# Patient Record
Sex: Female | Born: 1935 | Race: White | Hispanic: No | Marital: Married | Smoking: Never smoker
Health system: Southern US, Community
[De-identification: ages and names within clinical notes are randomized; demographics above are authoritative.]

## PROBLEM LIST (undated history)

## (undated) DIAGNOSIS — E079 Disorder of thyroid, unspecified: Secondary | ICD-10-CM

## (undated) DIAGNOSIS — I639 Cerebral infarction, unspecified: Secondary | ICD-10-CM

## (undated) DIAGNOSIS — I10 Essential (primary) hypertension: Secondary | ICD-10-CM

## (undated) DIAGNOSIS — F329 Major depressive disorder, single episode, unspecified: Secondary | ICD-10-CM

## (undated) DIAGNOSIS — F32A Depression, unspecified: Secondary | ICD-10-CM

## (undated) DIAGNOSIS — K5790 Diverticulosis of intestine, part unspecified, without perforation or abscess without bleeding: Secondary | ICD-10-CM

## (undated) DIAGNOSIS — E785 Hyperlipidemia, unspecified: Secondary | ICD-10-CM

## (undated) DIAGNOSIS — E876 Hypokalemia: Secondary | ICD-10-CM

## (undated) DIAGNOSIS — F419 Anxiety disorder, unspecified: Secondary | ICD-10-CM

## (undated) DIAGNOSIS — M1990 Unspecified osteoarthritis, unspecified site: Secondary | ICD-10-CM

## (undated) DIAGNOSIS — M810 Age-related osteoporosis without current pathological fracture: Secondary | ICD-10-CM

## (undated) DIAGNOSIS — K219 Gastro-esophageal reflux disease without esophagitis: Secondary | ICD-10-CM

## (undated) HISTORY — PX: ABDOMINAL HYSTERECTOMY: SHX81

## (undated) HISTORY — PX: TONSILLECTOMY: SUR1361

---

## 1998-06-16 ENCOUNTER — Ambulatory Visit (HOSPITAL_COMMUNITY): Admission: RE | Admit: 1998-06-16 | Discharge: 1998-06-16 | Payer: Self-pay | Admitting: *Deleted

## 1998-06-16 ENCOUNTER — Encounter: Payer: Self-pay | Admitting: *Deleted

## 2002-02-13 ENCOUNTER — Ambulatory Visit (HOSPITAL_COMMUNITY): Admission: RE | Admit: 2002-02-13 | Discharge: 2002-02-13 | Payer: Self-pay | Admitting: Gastroenterology

## 2002-02-13 ENCOUNTER — Encounter (INDEPENDENT_AMBULATORY_CARE_PROVIDER_SITE_OTHER): Payer: Self-pay | Admitting: Specialist

## 2007-06-05 ENCOUNTER — Ambulatory Visit: Payer: Self-pay | Admitting: Gastroenterology

## 2007-07-11 ENCOUNTER — Ambulatory Visit: Payer: Self-pay | Admitting: Gastroenterology

## 2007-07-11 ENCOUNTER — Encounter: Payer: Self-pay | Admitting: Gastroenterology

## 2007-08-18 ENCOUNTER — Emergency Department (HOSPITAL_COMMUNITY): Admission: EM | Admit: 2007-08-18 | Discharge: 2007-08-18 | Payer: Self-pay | Admitting: Emergency Medicine

## 2007-09-05 ENCOUNTER — Ambulatory Visit: Payer: Self-pay | Admitting: Gastroenterology

## 2007-09-18 ENCOUNTER — Emergency Department (HOSPITAL_COMMUNITY): Admission: EM | Admit: 2007-09-18 | Discharge: 2007-09-19 | Payer: Self-pay | Admitting: Emergency Medicine

## 2007-10-02 ENCOUNTER — Inpatient Hospital Stay (HOSPITAL_COMMUNITY): Admission: EM | Admit: 2007-10-02 | Discharge: 2007-10-06 | Payer: Self-pay | Admitting: Emergency Medicine

## 2007-10-09 ENCOUNTER — Ambulatory Visit: Payer: Self-pay | Admitting: Internal Medicine

## 2007-10-13 DIAGNOSIS — E079 Disorder of thyroid, unspecified: Secondary | ICD-10-CM | POA: Insufficient documentation

## 2007-10-13 DIAGNOSIS — D126 Benign neoplasm of colon, unspecified: Secondary | ICD-10-CM

## 2007-10-13 DIAGNOSIS — K5730 Diverticulosis of large intestine without perforation or abscess without bleeding: Secondary | ICD-10-CM | POA: Insufficient documentation

## 2007-10-13 DIAGNOSIS — R630 Anorexia: Secondary | ICD-10-CM

## 2007-10-13 DIAGNOSIS — M129 Arthropathy, unspecified: Secondary | ICD-10-CM | POA: Insufficient documentation

## 2007-10-13 DIAGNOSIS — R634 Abnormal weight loss: Secondary | ICD-10-CM

## 2008-02-28 ENCOUNTER — Inpatient Hospital Stay (HOSPITAL_COMMUNITY): Admission: EM | Admit: 2008-02-28 | Discharge: 2008-03-01 | Payer: Self-pay | Admitting: Emergency Medicine

## 2008-08-12 ENCOUNTER — Ambulatory Visit: Payer: Self-pay | Admitting: Diagnostic Radiology

## 2008-08-12 ENCOUNTER — Emergency Department (HOSPITAL_BASED_OUTPATIENT_CLINIC_OR_DEPARTMENT_OTHER): Admission: EM | Admit: 2008-08-12 | Discharge: 2008-08-12 | Payer: Self-pay | Admitting: Emergency Medicine

## 2010-11-13 ENCOUNTER — Inpatient Hospital Stay (HOSPITAL_COMMUNITY)
Admission: EM | Admit: 2010-11-13 | Discharge: 2010-11-18 | DRG: 690 | Disposition: A | Discharge status: Nursing Home (Includes ICF) | Payer: Medicare Other | Attending: Internal Medicine | Admitting: Internal Medicine

## 2010-11-13 DIAGNOSIS — R0902 Hypoxemia: Secondary | ICD-10-CM | POA: Diagnosis not present

## 2010-11-13 DIAGNOSIS — E039 Hypothyroidism, unspecified: Secondary | ICD-10-CM | POA: Diagnosis present

## 2010-11-13 DIAGNOSIS — N289 Disorder of kidney and ureter, unspecified: Secondary | ICD-10-CM | POA: Diagnosis present

## 2010-11-13 DIAGNOSIS — F0280 Dementia in other diseases classified elsewhere without behavioral disturbance: Secondary | ICD-10-CM | POA: Diagnosis present

## 2010-11-13 DIAGNOSIS — K219 Gastro-esophageal reflux disease without esophagitis: Secondary | ICD-10-CM | POA: Diagnosis present

## 2010-11-13 DIAGNOSIS — J9819 Other pulmonary collapse: Secondary | ICD-10-CM | POA: Diagnosis not present

## 2010-11-13 DIAGNOSIS — M810 Age-related osteoporosis without current pathological fracture: Secondary | ICD-10-CM | POA: Diagnosis present

## 2010-11-13 DIAGNOSIS — R5381 Other malaise: Secondary | ICD-10-CM | POA: Diagnosis present

## 2010-11-13 DIAGNOSIS — N10 Acute tubulo-interstitial nephritis: Principal | ICD-10-CM | POA: Diagnosis present

## 2010-11-13 DIAGNOSIS — E876 Hypokalemia: Secondary | ICD-10-CM | POA: Diagnosis present

## 2010-11-13 DIAGNOSIS — Z87440 Personal history of urinary (tract) infections: Secondary | ICD-10-CM

## 2010-11-13 DIAGNOSIS — B964 Proteus (mirabilis) (morganii) as the cause of diseases classified elsewhere: Secondary | ICD-10-CM | POA: Diagnosis present

## 2010-11-13 DIAGNOSIS — G309 Alzheimer's disease, unspecified: Secondary | ICD-10-CM | POA: Diagnosis present

## 2010-11-13 LAB — URINALYSIS, ROUTINE W REFLEX MICROSCOPIC
Nitrite: NEGATIVE
Protein, ur: 30 mg/dL — AB
Specific Gravity, Urine: 1.018 (ref 1.005–1.030)
Urobilinogen, UA: 0.2 mg/dL (ref 0.0–1.0)

## 2010-11-13 LAB — DIFFERENTIAL
Eosinophils Absolute: 0 10*3/uL (ref 0.0–0.7)
Eosinophils Relative: 0 % (ref 0–5)
Lymphocytes Relative: 2 % — ABNORMAL LOW (ref 12–46)
Lymphs Abs: 0.3 10*3/uL — ABNORMAL LOW (ref 0.7–4.0)
Monocytes Relative: 6 % (ref 3–12)

## 2010-11-13 LAB — COMPREHENSIVE METABOLIC PANEL
Albumin: 3.6 g/dL (ref 3.5–5.2)
Alkaline Phosphatase: 39 U/L (ref 39–117)
BUN: 25 mg/dL — ABNORMAL HIGH (ref 6–23)
Chloride: 104 mEq/L (ref 96–112)
Creatinine, Ser: 1.27 mg/dL — ABNORMAL HIGH (ref 0.4–1.2)
GFR calc non Af Amer: 40 mL/min — ABNORMAL LOW (ref >60)
Glucose, Bld: 116 mg/dL — ABNORMAL HIGH (ref 70–99)
Potassium: 4.7 mEq/L (ref 3.5–5.1)
Total Bilirubin: 1.3 mg/dL — ABNORMAL HIGH (ref 0.3–1.2)

## 2010-11-13 LAB — URINE MICROSCOPIC-ADD ON

## 2010-11-13 LAB — CBC
HCT: 39.9 % (ref 36.0–46.0)
MCH: 29.5 pg (ref 26.0–34.0)
MCV: 90.5 fL (ref 78.0–100.0)
Platelets: 170 10*3/uL (ref 150–400)
RBC: 4.41 MIL/uL (ref 3.87–5.11)
WBC: 16 10*3/uL — ABNORMAL HIGH (ref 4.0–10.5)

## 2010-11-13 NOTE — H&P (Signed)
[name], Robbins NO.:  [id number]  MEDICAL RECORD NO.:  [id number]           PATIENT TYPE:  E  LOCATION:  WLED                         FACILITY:  [facility]  PHYSICIAN:  Andreas Blower, MD       DATE OF BIRTH:  February 18, 1923  DATE OF ADMISSION:  11/13/2010 DATE OF DISCHARGE:                             HISTORY & PHYSICAL   PRIMARY CARE PHYSICIAN:  Gloriajean Dell. Andrey Campanile, MD, the patient is also seen by Dr. Dara Hoyer.  CHIEF COMPLAINT:  Fever, chills, and bilateral flank pain.  HISTORY OF PRESENT ILLNESS:  Brianna Robbins is an 77 year old Caucasian female with history of dementia, hypertension, GERD, hypercholesterolemia, hypothyroidism, who presented with fever, chills, and bilaterally flank pain. The patient has a history of frequent urinary tract infections which is managed by Dr. Aldean Ast and is on ciprofloxacin 250 mg p.o. daily chronically.  The patient also has a history of chronic incontinence.  Today the patient woke up this morning at 4 o'clock with back hurting, the patient feeling nauseated, and having some chills.  She was given some Tylenol and meclizine and was transferred to [facility] ED for further evaluation. Currently, the patient reports that her back pain has improved.  While she was in the ER, she was found to be mildly hypotensive with a blood pressure of 95/41, as a result the hospitalist service was asked to admit the patient.  Other than her symptoms today, her family member who is providing most of the history given the patient's dementia reports that she has not complained of any chest pain, shortness of breath, has not complained of any abdominal pain, has had good bowel movements today, has not had any headaches or vision changes.  REVIEW OF SYSTEMS:  All systems were reviewed with the patient was positive as per HPI, otherwise all other systems are negative.  PAST MEDICAL HISTORY: 1. Hypertension. 2. GERD. 3. Dementia. 4.  Hypercholesterolemia. 5. Hypothyroidism. 6. History of shingles. 7. History of falls. 8. History of osteoarthritis. 9. History of osteoporosis, on chronic bisphosphonates. 10.Status post hysterectomy and bilateral tubal ligation. 11.History of normocytic anemia. 12.History of lower GI bleed, thought to be diverticular in nature.  SOCIAL HISTORY:  The patient does not smoke, does not drink any alcohol, currently lives at [facility].  FAMILY HISTORY:  Significant for father having diabetes and heart problems, mother having stroke and heart attack.  HOME MEDICATIONS: 1. Omeprazole 20 mg p.o. daily. 2. Hydrochlorothiazide 12.5 mg p.o. every other day. 3. Alendronate 70 mg p.o. weekly on Tuesdays. 4. Fish oil 3000 mg p.o. daily. 5. Vitamin D3 1000 IU p.o. daily. 6. Multivitamin 1 tablet daily. 7. Synthroid 88 mcg p.o. daily. 8. Nadolol 40 mg p.o. daily. 9. Vytorin 10/40 p.o. daily. 10.Potassium chloride 20 mEq p.o. daily. 11.Aspirin 81 mg p.o. daily. 12.Cranberry powder 850 mg capsule p.o. daily. 13.Tylenol 650 mg p.o. q.6 h. p.r.n. 14.Ciprofloxacin 250 mg p.o. nightly. 15.Ambien 10 mg p.o. nightly p.r.n. 16.Prozac 30 mg p.o. daily. 17.Lorazepam 0.5 mg every 8 hours as needed for anxiety. 18.Meclizine 12.5 mg p.o. q.4 h. p.r.n.  x3 doses.  PHYSICAL EXAMINATION:  VITAL SIGNS:  Temperature is 98.9, blood pressure is 95/41, heart rate 74, respirations 16, saturating at 94% on room air. During examination the patient's blood pressure was improved at 105/50. GENERAL:  The patient is awake, oriented to self and location.  She is not oriented to time, is lying in bed comfortably. HEENT:  Extraocular motions are intact.  Pupils are equal, round.  Has dry mucous membranes. NECK:  Supple. HEART:  Regular with S1 and S2. LUNGS:  Clear to auscultation bilaterally. ABDOMEN:  Soft, nontender, nondistended.  Positive bowel sounds.  Has some tenderness in the  left mid quadrant. EXTREMITIES:  The patient good peripheral pulses with trace edema. NEUROLOGIC:  Cranial nerves II through XII grossly intact.  Has 5/5 motor strength in upper as well as lower extremities.  LABORATORY DATA:  CBC shows a white count of 16.0, hemoglobin 13.0, hematocrit 39.3, platelet count 170.  Electrolytes, normal except BUN is 25, creatinine is 1.25.  Total bilirubin is 1.3, alkaline phosphatase is 39, AST is 58, ALT is 26, total protein is 6.2, albumin is 3.6.  UA was negative for nitrites and large leukocytes, had many bacteria.  Urine culture is pending.  RADIOLOGY/IMAGING:  No imaging obtained.  ASSESSMENT AND PLAN: 1. Recurrent urinary tract infections/pyelonephritis.  The patient     complaining about fever, chills, bilateral flank pain today, there     is concern for pyelonephritis.  Urine culture has been sent.  We     will send for blood cultures x2.  Given the patient has been on     chronic ciprofloxacin, will start her on ceftazidime after     discussion with pharmacy. 2. Hypotension/early sepsis. I suspect that the patient is     dehydrated.  Her blood pressure is improving with fluids, as a     result we will aggressively hydrate her.  We will aggressively     hydrate her and we will also hold some of her antihypertensive     medications such as hydrochlorothiazide and we will continue     nadolol with hold parameters. 3. Leukocytosis. Secondary to UTI. 4. Mild acute renal insuffiency. Likely secondary to dehydration.     Monitor renal function for now, and reassess after hydration. 5. Dehydration.  As mentioned above, we will aggressively hydrate her     with IV fluids. 6. Hypothyroidism.  Continue Synthroid. 7. Gastroesophageal reflux disease.  Continue the patient on PPI. 8. Hyperlipidemia.  Continue fish oil and Vytorin. 9. Depression.  We will continue Prozac. 10.Hypertension.  As discussed above, we will continue nadolol with     hold  parameters, currently holding hydrochlorothiazide. 11.Osteoporosis and osteoarthritis. We will hold bisphosphonates while     the patient is admitted and continue vitamin D. 12.Anxiety.  Continue p.r.n. Ativan. 13.Prophylaxis, Lovenox. 14.Code status.  The patient is DNR/DNI.  Has a "GOLD DNR form" at     signed by her primary care physician. Her code status was also     confirmed with the patient's sister at the time of admission.  Time spent on admission talking to the patient, patient's family, and coordinating care was 50 minutes.   Andreas Blower, MD   SR/MEDQ  D:  11/13/2010  T:  11/13/2010  Job:  [id number]  Electronically Signed by Wardell Heath Sarrah Fiorenza  on 11/13/2010 09:08:40 PM

## 2010-11-14 LAB — BASIC METABOLIC PANEL
CO2: 24 mEq/L (ref 19–32)
Chloride: 108 mEq/L (ref 96–112)
Creatinine, Ser: 1.04 mg/dL (ref 0.4–1.2)
GFR calc Af Amer: >60 mL/min (ref >60)
Glucose, Bld: 101 mg/dL — ABNORMAL HIGH (ref 70–99)

## 2010-11-14 LAB — CBC
HCT: 33.2 % — ABNORMAL LOW (ref 36.0–46.0)
Hemoglobin: 10.6 g/dL — ABNORMAL LOW (ref 12.0–15.0)
MCH: 29 pg (ref 26.0–34.0)
MCHC: 31.9 g/dL (ref 30.0–36.0)
MCV: 91 fL (ref 78.0–100.0)
RBC: 3.65 MIL/uL — ABNORMAL LOW (ref 3.87–5.11)

## 2010-11-15 ENCOUNTER — Inpatient Hospital Stay (HOSPITAL_COMMUNITY): Payer: Medicare Other

## 2010-11-15 LAB — COMPREHENSIVE METABOLIC PANEL
Albumin: 2.7 g/dL — ABNORMAL LOW (ref 3.5–5.2)
BUN: 17 mg/dL (ref 6–23)
CO2: 27 mEq/L (ref 19–32)
Calcium: 8 mg/dL — ABNORMAL LOW (ref 8.4–10.5)
Chloride: 110 mEq/L (ref 96–112)
Creatinine, Ser: 0.78 mg/dL (ref 0.4–1.2)
GFR calc non Af Amer: >60 mL/min (ref >60)
Total Bilirubin: 0.5 mg/dL (ref 0.3–1.2)

## 2010-11-15 LAB — CBC
MCH: 29.3 pg (ref 26.0–34.0)
MCHC: 31.9 g/dL (ref 30.0–36.0)
MCV: 91.8 fL (ref 78.0–100.0)
Platelets: 135 10*3/uL — ABNORMAL LOW (ref 150–400)
RBC: 3.65 MIL/uL — ABNORMAL LOW (ref 3.87–5.11)

## 2010-11-16 LAB — CBC
HCT: 34 % — ABNORMAL LOW (ref 36.0–46.0)
MCH: 28.6 pg (ref 26.0–34.0)
MCHC: 31.8 g/dL (ref 30.0–36.0)
MCV: 89.9 fL (ref 78.0–100.0)
RDW: 13.9 % (ref 11.5–15.5)

## 2010-11-17 ENCOUNTER — Inpatient Hospital Stay (HOSPITAL_COMMUNITY): Payer: Medicare Other

## 2010-11-17 LAB — BASIC METABOLIC PANEL
CO2: 29 mEq/L (ref 19–32)
Calcium: 8.4 mg/dL (ref 8.4–10.5)
Creatinine, Ser: 0.62 mg/dL (ref 0.4–1.2)
GFR calc Af Amer: >60 mL/min (ref >60)

## 2010-11-17 LAB — URINE CULTURE
Colony Count: >=100000
Culture  Setup Time: 201203310131

## 2010-11-17 LAB — CBC
Hemoglobin: 11.2 g/dL — ABNORMAL LOW (ref 12.0–15.0)
MCH: 28.6 pg (ref 26.0–34.0)
MCHC: 31.9 g/dL (ref 30.0–36.0)
Platelets: 157 10*3/uL (ref 150–400)

## 2010-11-20 LAB — CULTURE, BLOOD (ROUTINE X 2): Culture  Setup Time: 201203310131

## 2010-11-23 NOTE — Discharge Summary (Signed)
Brianna Robbins, Brianna Robbins                 ACCOUNT NO.:  [id number]  MEDICAL RECORD NO.:  [id number]           PATIENT TYPE:  I  LOCATION:  1319                         FACILITY:  [facility]  PHYSICIAN:  Hollice Espy, M.D.DATE OF BIRTH:  Oct 18, 1922  DATE OF ADMISSION:  11/13/2010 DATE OF DISCHARGE:                        DISCHARGE SUMMARY - REFERRING   PRIMARY CARE PHYSICIAN:  Gloriajean Dell. Andrey Campanile, M.D.  DISCHARGE DIAGNOSES: 1. Proteus urinary tract infection/pyelonephritis. 2. Mild bronchitis. 3. Hypoxia, felt to be secondary to atelectasis versus mild     bronchitis. 4. Alzheimer's dementia. 5. Deconditioning. 6. Acute renal insufficiency, now resolved. 7. Dehydration secondary to urinary tract infection, now resolved. 8. History depression. 9. History of hypothyroidism. 10.History of gastroesophageal reflux disease. 11.History of osteoarthritis.  All discharge diagnoses were present on admission.  DISPOSITION:  Disposition for this patient is improved.  ACTIVITY:  Activity is as per assisted living, PT/OT.  DISCHARGE DIET:  Low-sodium diet.  HOSPITAL COURSE:  The patient is an 77 year old white female with past medical history of Alzheimer's dementia, hypertension and depression, who presented with weakness.  She has a history of recurrent urinary tract infection and was found to have large urinary tract infection with acute pyelonephritis.  She was started on IV Rocephin.  Over the next several days, she improved.  She was started on IV fluids and she responded well.  Initially, the plan was to send her home on November 17, 2010 but it was noted that she was quite winded.  Physical therapy checked the patient and she is found to have some decreased O2 saturations.  With ambulation, her saturations  were up to about anywhere from 86% to 89%.  The patient was put on 2 liters of oxygen.  A chest x-ray was ordered.  She had a questionable small area of infiltrate at the left base,  although it was twisted and it was very poor film.  The patient had been afebrile regardless.  In addition, BNP was checked and found to be only minimally elevated in the 200s.  Given these findings, it was felt likely hypoxia.  Hypoxia may be from a mild bronchitis versus some atelectasis from being in the chair and bed during her hospitalization.  The patient was started on 2 liters of oxygen.  Encourage using spirometer.  Zithromax was started on the patient by 1 day later.  The patient was breathing comfortably.  She was ambulating well on the walker.  She was not felt to need short-term skilled nursing and could go back to assisted living with home health PT/OT.  In addition, we will go ahead and treat her.  She has had a bronchitis with 5 more days of p.o. Zithromax and her urine culture grew positive for Proteus which was sensitive to cephalosporin, so we will change her Rocephin which she has received 5 days worth for 2 more days for a total of 7 days of therapy.  The patient was kept as a DNR as per her living will during her entire hospitalization.  She is otherwise felt to be medically stable and she will be discharged on November 18, 2010.  She will follow up with her PCP, Dr. Benedetto Goad, in the next 2 to 4 weeks.     Hollice Espy, M.D.     SKK/MEDQ  D:  11/18/2010  T:  11/18/2010  Job:  [id number]  cc:   Gloriajean Dell. Andrey Campanile, M.D. Fax: [phone]  [address]  Electronically Signed by Virginia Rochester M.D. on 11/23/2010 01:58:18 PM

## 2010-12-29 NOTE — Assessment & Plan Note (Signed)
[facility]                         GASTROENTEROLOGY OFFICE NOTE   NAME:Robbins, Brianna Robbins                        MRN:          [id number]  DATE:09/05/2007                            DOB:          10-09-1922    PROBLEM:  Nausea.   [name] has returned for reevaluation since her colonoscopy in early  December.  She is on no new medications.  She has been complaining of  anorexia.  She claims to have lost about 12 pounds.  She is also having  loose stools.  This is not new for her, but it is worse than prior to  her procedure.  She is without abdominal pain.   EXAMINATION:  Pulse 56.  Blood pressure 100/60.  Weight 155.  HEENT: EOMI.  PERRLA.  Sclerae are anicteric.  Conjunctivae are pink.  NECK:  Supple without thyromegaly, adenopathy or carotid bruits.  CHEST:  Clear to auscultation and percussion without adventitious  sounds.  CARDIAC:  Regular rhythm; normal S1 S2.  There are no murmurs, gallops  or rubs.  ABDOMEN:  There is no succussion splash.  Bowel sounds are normoactive.  Abdomen is soft, nontender and nondistended.  There are no abdominal  masses, tenderness, splenic enlargement or hepatomegaly.  EXTREMITIES:  Full range of motion.  No cyanosis, clubbing or edema.  RECTAL:  Deferred.   IMPRESSION:  Anorexia with weight loss.  Symptoms certainly suggest a  medication-effect, though she has had no change in her medications.  Loose bowels are nothing new for her.  She is on several medications  that can cause her symptoms of nausea and diarrhea, including  nitrofurantoin, omeprazole, Paxil.   RECOMMENDATION:  Trial of Levbid 0.375 mg twice a day.  If she is not  improved with this, then I would consider stopping her medication for  possible offending medications, one at a time.     Barbette Hair. Arlyce Dice, MD,FACG  Electronically Signed    RDK/MedQ  DD: 09/05/2007  DT: 09/05/2007  Job #: [id number]   cc:   Caryn Bee L. Little, M.D.

## 2010-12-29 NOTE — Letter (Signed)
June 05, 2007    [name]   RE:  Brianna Robbins, Brianna Robbins  MRN:  [id number]  /  DOB:  1922/12/13   Dear [name]:   It is my pleasure to have treated you recently as a new patient in my  office.  I appreciate your confidence and the opportunity to participate  in your care.   Since I do have a busy inpatient endoscopy schedule and office schedule,  my office hours vary weekly.  I am, however, available for emergency  calls every day through my office.  If I cannot promptly meet an urgent  office appointment, another one of our gastroenterologists will be able  to assist you.   My well-trained staff are prepared to help you at all times.  For  emergencies after office hours, a physician from our gastroenterology  section is always available through my 24-hour answering service.   While you are under my care, I encourage discussion of your questions  and concerns, and I will be happy to return your calls as soon as I am  available.   Once again, I welcome you as a new patient and I look forward to a happy  and healthy relationship.    Sincerely,      Barbette Hair. Arlyce Dice, MD,FACG  Electronically Signed   RDK/MedQ  DD: 06/05/2007  DT: 06/06/2007  Job #: [id number]

## 2010-12-29 NOTE — H&P (Signed)
NAMEYEILY, LINK                 ACCOUNT NO.:  [id number]   MEDICAL RECORD NO.:  [id number]          PATIENT TYPE:  INP   LOCATION:  1534                         FACILITY:  [facility]   PHYSICIAN:  Hedwig Morton. Juanda Chance, MD     DATE OF BIRTH:  1922/08/25   DATE OF ADMISSION:  10/02/2007  DATE OF DISCHARGE:                              HISTORY & PHYSICAL   PROBLEM:  GI bleeding.   HISTORY:  Brianna Robbins is a pleasant, 77 year old, white female recently known  to Dr. Arlyce Dice having undergone colonoscopy for screening on July 11, 2007. She does have a positive family history of colon cancer.  She was  found at that time to have several diminutive colon polyps and diffuse  left-sided diverticulosis. She also has a history of hypothyroidism,  osteoarthritis, mild dementia, is status post hysterectomy and bilateral  tubal ligation.  The patient has been ill over the past month or so  initially with shingles and then a viral illness with nausea, anorexia,  etc. which required an ER visit for hydration.  Apparently she had onset  this morning about 4:00 a.m. with an urge for a bowel movement, got up,  went to the bathroom and had a gush of blood in the commode.  Apparently  she had also worn a sanitary pad to bed because of bladder control  issues and that was soaked with blood as well.  She had not noticed any  melena or hematochezia.  last week.  No complaints of diarrhea.  Denies  any current abdominal pain, did not have any syncope or presyncope, but  does feel a bit lightheaded. She has not had any nausea or vomiting.  Her sister who accompanies her says that she has had a lot of recent  falls due to lightheadedness. She was seen by Dr. Clarene Duke this morning in  his office, noted to have bright red blood on rectal exam and then  referred to GI for admission.  She has not had any further bowel  movements thus far.   CURRENT LABS:  Labs on September 18, 2007 showed a hemoglobin of 12.8,  hematocrit of  37, WBC of 8. Follow up on the 16th shows a hemoglobin of  11.4, hematocrit of 32.6, WBC of 8.5, platelets 299.  The remainder of  labs are pending.   CURRENT MEDICATIONS:  1. HCTZ 25 daily.  2. Vytorin 10/40 daily.  3. Aricept 10 daily.  4. Prozac 20 daily.  5. Baby aspirin 81 mg daily.  6. Nadolol 40 mg daily.  7. Prilosec 20 daily.  8. Fosamax 70 weekly.  9. Levothyroxine 88 mcg daily.   ALLERGIES:  SULFA which causes itching.   FAMILY HISTORY:  Mother with history of colon cancer.   SOCIAL HISTORY:  The patient is married, lives with her husband who has  Alzheimer's. The patient has a sister who has been helping them but does  not live with them. No tobacco and no ETOH.   REVIEW OF SYSTEMS:  GENERAL:  Pertinent for weakness and recent weight  loss.  She says she  feels like she is falling apart.  CARDIOVASCULAR:  Denies any chest pain or anginal symptoms.  PULMONARY:  Negative for  cough, shortness of breath, sputum production.  GENITOURINARY:  Denies  any dysuria, urgency or frequency.  GI: As above.  MUSCULOSKELETAL:  Positive for a recent fall with a bruise on her lower right back.  NEURO:  Unremarkable. All other review of systems negative.   PHYSICAL EXAM:  Well-developed elderly white female in no acute  distress.  She is alert and oriented x3, blood pressure 130/60, pulse in  the 70s, temperature is 97.  HEENT:  Nontraumatic, normocephalic.  EOMI, PERRLA.  Sclerae anicteric.  NECK:  Supple.  There is no JVD.  She does have a fading rash of zoster  over her right shoulder.  LUNGS:  Clear to A and P.  CARDIOVASCULAR:  Regular rate and rhythm with S1 and S2.  There is no  murmur, rub or gallop.  ABDOMEN:  Bowel sounds are active. She is tender in the left mid  quadrant with a smooth fullness in the left mid quadrant which is  tender. This may be the splenic edge which is then enlarged. No  hepatomegaly noted.  RECTAL:  Not repeated. There was some maroon stool noted  per Dr. Clarene Duke  earlier this morning.  NEURO:  The patient is alert and oriented x3.  EXTREMITIES:  No clubbing, cyanosis or edema. She does have a small a  bruise on her right back.   IMPRESSION:  1. An 77 year old white female with acute lower gastrointestinal      bleed, probably diverticular.  Rule out ischemic.  2. Normocytic anemia secondary to above.  3. Left mid quadrant abdominal tenderness and fullness of unclear      etiology, question splenomegaly.  4. Recent anorexia and weight loss of unclear etiology. She has had      recent herpes zoster and this may be secondary.  5. Hypertension.  6. Osteoarthritis.  7. Status post hysterectomy and bilateral tubal ligation.  8. Hypothyroidism.  9. Recent falls.   PLAN:  The patient is admitted to the service of Dr. Lina Sar for  serial H&H transfusions as needed.  She will be kept on a clear liquid  diet, will transfuse to keep her hemoglobin between 9 and 10. Will plan  to hold her blood pressure medicines and aspirin initially and will  check CT scan of the abdomen and pelvis to assess left abdominal  fullness, recent weight loss, anorexia, etc. For details please see the  orders.      Amy Esmond, PA-C      Dora M. Juanda Chance, MD  Electronically Signed    AE/MEDQ  D:  10/02/2007  T:  10/03/2007  Job:  [id number]   cc:   Caryn Bee L. Little, M.D.  Fax: [phone]

## 2010-12-29 NOTE — Letter (Signed)
June 05, 2007    Anna Genre. Little, M.D.  [address]  [address] [place], [place] [address]   RE:  [name], Brianna Robbins  MRN:  [id number]  /  DOB:  10/07/1922   Dear Dr. Clarene Duke:   Upon your kind referral, I had the pleasure of evaluating your patient  and I am pleased to offer my findings.  I saw [name] in the office  today.  Enclosed is a copy of my progress note that details my findings  and recommendations.   Thank you for the opportunity to participate in your patient's care.    Sincerely,      Barbette Hair. Arlyce Dice, MD,FACG  Electronically Signed    RDK/MedQ  DD: 06/05/2007  DT: 06/06/2007  Job #: [id number]

## 2010-12-29 NOTE — H&P (Signed)
Brianna Robbins, Brianna Robbins                 ACCOUNT NO.:  [id number]   MEDICAL RECORD NO.:  [id number]          PATIENT TYPE:  INP   LOCATION:  4732                         FACILITY:  [facility]   PHYSICIAN:  Kela Millin, M.D.DATE OF BIRTH:  05-20-1923   DATE OF ADMISSION:  02/28/2008  DATE OF DISCHARGE:                              HISTORY & PHYSICAL   CHIEF COMPLAINT:  Worsening gait imbalance and speech difficulty.   HISTORY OF PRESENT ILLNESS:  The patient is an 77 year old white female  with past medical history significant for hypertension,  hypercholesterolemia, hypothyroidism, and history of herpes zoster,  recently diagnosed with post herpetic neuralgia and started on  amitriptyline (a total of 100 mg daily) per her dermatologist, dementia,  history of falls who presents with the above complaints.  The history is  obtained from her sister, who is at the bedside, and her primary care  physician.  Her sister states that about 3-4 days ago Brianna Robbins  complained of her tongue feeling sore and she noticed that she was  having some speech difficulty at the time as well - stating that it felt  like her tongue was thick.  When she went by to see her she appeared  to be staggering and having difficulties with her balance.  The symptoms  persisted and when she went back to check on her, on the day prior to  admission, she decided that she would take her to the walk-in clinic.  At the walk-in clinic, she was diagnosed with thrush and started on oral  nystatin.  On the day of presentation she went by again to visit and she  found that her gait/balance was worse - she was stumbling and could  hardly stand up without falling and also what was more confused than  usual, was more somnolent and having difficulty still with her speech.  Her sister states that she also noticed that her medications, which had  been laid out for her the night before for the whole day, had all been  taken already, and  this was still in the morning, so she is unsure  exactly what happened, whether she took them all at once is not clear.  The patient's sister also reports that the only new medication that  patient had been started on shortly before the onset of the symptoms was  the amitriptyline started by her dermatologist for post herpetic  neuralgia.  The patient reports a dry mouth as well.  She denies blurry  vision, focal weakness, dysphagia, nausea, vomiting, fevers, dysuria,  melena, cough, chest pain, diarrhea and no hematochezia.   The patient was seen at the office by Dr. Sigmund Hazel and was noted to  have difficulty articulating words and unable to stand without support  and also difficulty with finger-to-nose and rapidly alternating  movements upon exam and so she was directly admitted for further  evaluation and management.   PAST MEDICAL HISTORY:  1. As stated above.  2. History of GI bleed secondary to diverticular hemorrhage and      resulting anemia.  3. Hiatal  hernia.  4. Osteoarthritis.  5. Recent history of anorexia/weight loss secondary to multifactorial      etiology.   PAST SURGICAL HISTORY:  Status post hysterectomy and tubal ligation.   SOCIAL HISTORY:  She denies tobacco, also denies alcohol.  She is  married and lives with her husband who has Alzheimer's and her sister  helps out but does not live with them.   FAMILY HISTORY:  Her mother had colon cancer.   REVIEW OF SYSTEMS:  As per HPI.   PHYSICAL EXAM:  GENERAL:  The patient is an overweight elderly white  female with some hearing impairment, in no apparent distress.  Alert,  oriented to person.  VITAL SIGNS:  Temperature is 97.2 with a pulse of 57, respiratory rate  is 20, blood pressure 146/79, O2 sat 97% on room air.  HEENT:  PERRL, EOMI, sclerae anicteric, slightly dry mucous membranes,  no oral exudates.  NECK:  Supple, no adenopathy, no thyromegaly, no JVD and no carotid  bruits.  LUNGS:  Clear to  auscultation bilaterally, no crackles or wheezes.  CARDIOVASCULAR:  Regular, normal S1, S2.  ABDOMEN:  Obese, soft, bowel sounds present, nontender, nondistended, no  organomegaly and no masses palpable.  EXTREMITIES:  No cyanosis, no edema.  NEURO:  She follows simple commands, cranial nerves II-XII are grossly  intact, she misses the target on finger-to-nose testing, she does well  with the heel-to-shin testing bilaterally, Romberg's is positive, she is  unable to stand with her feet together.  On her plantar reflexes, she  withdraws both feet.  No facial asymmetry, her tongue is midline.   LABORATORY DATA:  CT scan, CBC, CMET, TSH ordered and pending.   ASSESSMENT AND PLAN:  1. Speech difficulty and gait abnormality - As discussed above,      cerebrovascular accident versus medication/amitriptyline adverse      effects), known side effects include incoordination, ataxia,      confusion and disorientation.  Will obtain a CT scan of the head,      follow and consider MRI to evaluate posterior circulation.  Will      place on aspirin, follow and consider neuro consult pending above      studies.  Will consult physical therapy, occupational therapy as      well and follow.  2. Hypertension - Continue outpatient medications, hold      hydrochlorothiazide for now pending chemistries.  3. Hypothyroidism - Check TSH, continue outpatient medications.  4. History of falls - Physical therapy, occupational therapy consult      as above.  5. History of diverticular hemorrhage - check hemoglobin and      hematocrit, no evidence of gastrointestinal bleeding at this time,      follow.      Kela Millin, M.D.  Electronically Signed     ACV/MEDQ  D:  02/29/2008  T:  02/29/2008  Job:  [id number]   cc:   Caryn Bee L. Little, M.D.  Sigmund Hazel, M.D.

## 2010-12-29 NOTE — Assessment & Plan Note (Signed)
[name]                         GASTROENTEROLOGY OFFICE NOTE   NAME:Brianna Robbins, Robbins                        MRN:          [id number]  DATE:06/05/2007                            DOB:          08-May-1923    REASON FOR CONSULTATION:  Screening colonoscopy.   REASON:  Brianna Robbins is a pleasant 77 year old white female referred  through the courtesy of Dr. Clarene Duke for evaluation.  Family history is  pertinent for her father who had colon cancer at age 26.  Brianna Robbins had  no GI complaints except for her typical 3 bowel movements a day  following each meal.  She underwent colonoscopy with removal of a  diminutive hyperplastic polyp in 2003.   HPI:  Left sided diverticula were also seen.   PAST MEDICAL HISTORY:  Is pertinent for:  1. Arthritis.  2. Thyroid disease.  3. She is status post hysterectomy, tubal ligation.   FAMILY HISTORY:  Pertinent for:  1. Heart disease in both parents.  2. Father with diabetes.   MEDICATIONS:  HCTZ, Vytorin, nadolol, potassium, Baby Aspirin,  levothyroxine, nitrofurantoin, omeprazole, fluoxetine, Aricept,  alendronate, and Fish Oil.   SHE IS ALLERGIC TO SULFA.   She neither smokes nor drinks.  She is married and retired.   REVIEW OF SYSTEMS:  Positive for occasional pyrosis, joint pain,  sleeping problems, back pain, excessive urination, and loss of hearing.   PHYSICAL EXAMINATION:  Pulse 60, blood pressure 136/80, weight 178.  HEENT: EOMI.  PERRLA.  Sclerae are anicteric.  Conjunctivae are pink.  NECK:  Supple without thyromegaly, adenopathy or carotid bruits.  CHEST:  Clear to auscultation and percussion without adventitious  sounds.  CARDIAC:  Regular rhythm; normal S1 S2.  There are no murmurs, gallops  or rubs.  ABDOMEN:  Bowel sounds are normoactive.  Abdomen is soft, nontender and  nondistended.  There are no abdominal masses, tenderness, splenic  enlargement or hepatomegaly.  EXTREMITIES:  Full range of  motion.  No cyanosis, clubbing or edema.  RECTAL:  Deferred.   IMPRESSION:  1. Family history of colorectal carcinoma.  2. Diverticulosis.   RECOMMENDATION:  Colonoscopy.     Barbette Hair. Arlyce Dice, MD,FACG  Electronically Signed    RDK/MedQ  DD: 06/05/2007  DT: 06/06/2007  Job #: [id number]   cc:   Caryn Bee L. Little, M.D.

## 2010-12-29 NOTE — Discharge Summary (Signed)
NAMESALENE, Brianna Robbins                 ACCOUNT NO.:  [id number]   MEDICAL RECORD NO.:  [id number]          PATIENT TYPE:  INP   LOCATION:  1534                         [facility]ACILITY:  [facility]   PHYSICIAN:  Iva Boop, MD,FACGDATE OF BIRTH:  Jul 24, 1923   DATE OF ADMISSION:  10/02/2007  DATE OF DISCHARGE:  10/06/2007                               DISCHARGE SUMMARY   ADMITTING DIAGNOSES:  24. A 77 year old, white female with acute lower gastrointestinal      bleed, probable diverticular, rule out ischemic colitis.  2. Normocytic anemia secondary to above.  3. Left mid quadrant tenderness, questionable etiology.  4. Recent anorexia and weight loss, probably multifactorial with      recent herpes zoster and gastroenteritis.  5. Hypertension.  6. Osteoarthritis.  7. Status post hysterectomy and bilateral tubal ligation.  8. Hypothyroidism.  9. Recent falls.   DISCHARGE DIAGNOSES:  1. Stable status post acute diverticular hemorrhage.  2. Anemia secondary to #1, stable post transfusion.  3. A 5-cm, hiatal hernia, otherwise negative      esophagogastroduodenoscopy.  4. Recent anorexia and weight loss, again suspect multifactorial with      recent viral illness, herpes zoster and component of dementia.  5. Osteoarthritis.  6. Status post hysterectomy and bilateral tubal ligation.  7. Hypothyroidism.  8. Recent falls.   CONSULTATIONS:  None.   PROCEDURES:  1. Upper endoscopy and colonoscopy.  2. CT scan of the abdomen and pelvis.  3. HIDA scan.   HISTORY OF PRESENT ILLNESS:  Brianna Robbins is a pleasant, 77 year old, white  female recently known to Dr. Arlyce Dice, primary patient of Dr. Catha Gosselin  who underwent colonoscopy July 11, 2007, for screening.  She does  have positive family history of colon cancer.  She was found to have  several diminutive colon polyps and diffuse left-sided diverticular  disease.  She has been ill over the past month or so initially with an  apparent viral illness  and then shingles and has had a lot of pain from  that.  She has had anorexia, weight loss and some nausea.   On the morning of admission, she had onset about 4 a.m. with urge for  bowel movement, got up, went to the bathroom and had a gush of bright  red blood in the commode.  She apparently also soaked a sanitary pad  with blood that she wears because of bladder incontinence issues.  She  had not noted any blood in her bowel movements over the past week with  no diarrhea, etc.  She currently denies any abdominal pain.  She had not  had any nausea, vomiting, syncope, presyncope, etc., but does admit to  feeling lightheaded which is apparently a frequent symptom for her.  She  was seen by Dr. Clarene Duke in his office on the morning of admission, noted  to have bright red blood on rectal exam and referred to GI for  admission.  She has not had any further bowel movements thus far and is  admitted with probable diverticular hemorrhage.   LABORATORY DATA AND X-RAY FINDINGS:  On February  16, hemoglobin was  11.4, hematocrit of 32.6.  On February 17, hemoglobin 9.9, hematocrit of  28.3.  On February 18, hemoglobin 12.9 and hematocrit 37.6.  On February  19, hemoglobin 12.1 and hematocrit 34.8.  On February 20, hemoglobin  12.2, hematocrit of 35.1.  Electrolytes on admission within normal  limits with BUN 17, creatinine 0.8.  Liver function studies normal.  Albumin 2.9.  Serum iron 81, TIBC of 284, saturation of 29.  B12 was  171.   CT scan of the abdomen and pelvis done on February 16, with  cholelithiasis degenerative disk disease, sigmoid diverticulosis,  questionable mild wall thickening in the cecum.  HIDA scan done on  February 17, showed a patent cystic duct excluding acute cholecystitis.   HOSPITAL COURSE:  The patient was admitted to the service of Dr. Lina Sar who was covering the hospital.  She was initially placed on IV  fluids, bed rest clear liquids, serial H&H's.  By the  following morning,  she was continuing to have frequent, small bloody stools.  Her  hemoglobin had drifted and she was transfused 2 units of packed RBCs.  HIDA scan was obtained because of recent complaints of nausea, anorexia  and weight loss and finding of cholelithiasis on CT scan.  She continued  to have a somewhat stuttering bleed over the next couple of days and  remained hemodynamically stable.  She was prepped with MiraLax on  February 18, and then underwent colonoscopy with Dr. Leone Payor on February  19, because of persistent slow, stuttering bleed.  She was found to have  diverticulosis on the left side of the colon.  There was no bleeding  seen and no blood in the colon at that time.  She also had upper  endoscopy which showed a 5-cm hiatal hernia, otherwise normal exam.  Her  diet was advanced.  Her hemoglobin remained stable in the 12 range and  plan at this time is for her to be discharged to home today, October 06, 2007, with follow up with Dr. Catha Gosselin in 1-2 weeks.   DIET:  She is a placed on a regular diet to avoid popcorn, corn and  nuts.  Also, hold her aspirin.   DISCHARGE MEDICATIONS:  All medications same as on admission with the  exception of avoiding all aspirin and NSAIDs.   Discussion was had during this hospitalization regarding the patient and  her husband's ability to remain in her home.  Her husband has what  appears to be fairly advanced Alzheimer's and the patient does show some  signs of dementia herself and family is at this time in the process of  trying to get them resituated in an assisted-living facility which seems  most appropriate.      Amy Esterwood, PA-C      Iva Boop, MD,FACG  Electronically Signed    AE/MEDQ  D:  10/06/2007  T:  10/07/2007  Job:  [id number]

## 2011-01-01 NOTE — Procedures (Signed)
[facility]  Patient:    Brianna Robbins, Brianna Robbins Visit Number: [id number] MRN: [id number]          Service Type: END Location: ENDO Attending Physician:  Nelda Marseille Dictated by:   Petra Kuba, M.D. Proc. Date: 02/13/02 Admit Date:  02/13/2002 Discharge Date: 02/13/2002   CC:         Al Decant. Janey Greaser, M.D.   Procedure Report  PROCEDURE:  Colonoscopy with biopsy.  ENDOSCOPIST:  Petra Kuba, M.D.  INDICATIONS:  The patient has a family history of colon cancer and is due for colonic screening as well as some urgency and diarrhea.  INFORMED CONSENT:  Consent was signed after risks, benefits, methods and options were thoroughly discussed in the office.  MEDICATIONS USED:  Demerol 30 mg, Versed 7 mg.  DESCRIPTION OF PROCEDURE:  Rectal inspection was pertinent for external hemorrhoids.  Digital exam was negative.  The pediatric adjustable colonoscope was inserted and fairly easily advanced around the colon to the cecum.  This initially required rolling her on her back with some abdominal pressure and then rolling her on her right side.  On insertion, some left-sided diverticula were seen but no other abnormalities. The cecum and ascending had a fair prep with some stool adherent to the wall of the colon which required lots of washing and suctioning.  The rest of the prep was adequate.  The cecum was identified by the appendiceal orifice and the ileocecal valve.  The scope was slowly withdrawn.  The right side of the colon was normal.  As the scope was withdrawn around the left side, scattered diverticula were seen, but no polypoid lesions, masses or other abnormalities. Once back in the rectum, the scope was retroflexed pertinent for some internal hemorrhoids but also a tiny hyperplastic appearing polyp on retroflexion which was cold biopsied x1.  The scope was straightened and air was suctioned.  The scope was removed.  The patient tolerated  the procedure well.  There was no obvious immediate complication.  ENDOSCOPIC DIAGNOSES: 1.  Internal and external hemorrhoids. 2.  Tiny hyperplastic appearing rectal polyp status post cold biopsy on     retroflexion. 3.  Scattered left-sided diverticula. 4.  Largely within normal limits to the cecum.  PLAN:  Await pathology but consider screening if doing well medically in five years.  Happy to see back p.r.n.  Otherwise return care to Dr. Janey Greaser for the customary health care maintenance to include yearly rectals and guaiacs with consideration of trying an antispasmodic possibly either Carafate or Questran if symptoms continue.  We will continue work-up with an upper GI and small bowel follow through.  Will be happy to see back as above if needed.  May also check a gallbladder ultrasound although it does not sound like her symptoms are coming from that. Dictated by:   Petra Kuba, M.D. Attending Physician:  Nelda Marseille DD:  02/13/02 TD:  02/15/02 Job: [id number]

## 2011-05-07 LAB — CBC
HCT: 37
HCT: 28.8 — ABNORMAL LOW
HCT: 32.6 — ABNORMAL LOW
Hemoglobin: 11.4 — ABNORMAL LOW
Hemoglobin: 12
MCHC: 34.5
MCHC: 35.2
MCV: 87.6
MCV: 87.7
MCV: 87.9
Platelets: 235
Platelets: 256
RDW: 14.8
RDW: 15
RDW: 16 — ABNORMAL HIGH
WBC: 8

## 2011-05-07 LAB — COMPREHENSIVE METABOLIC PANEL
ALT: 16
Albumin: 3.9
Alkaline Phosphatase: 36 — ABNORMAL LOW
BUN: 10
BUN: 17
BUN: 5 — ABNORMAL LOW
CO2: 28
Calcium: 9.1
Calcium: 8.6
Chloride: 92 — ABNORMAL LOW
Creatinine, Ser: 0.57
Creatinine, Ser: 0.65
Creatinine, Ser: 0.82
GFR calc non Af Amer: >60
Glucose, Bld: 111 — ABNORMAL HIGH
Glucose, Bld: 131 — ABNORMAL HIGH
Potassium: 4.2
Sodium: 139
Total Bilirubin: 1.2
Total Bilirubin: 0.6
Total Protein: 5.2 — ABNORMAL LOW
Total Protein: 6.3

## 2011-05-07 LAB — HEMOGLOBIN AND HEMATOCRIT, BLOOD
HCT: 28.3 — ABNORMAL LOW
HCT: 35.1 — ABNORMAL LOW
HCT: 37.6
Hemoglobin: 12.2
Hemoglobin: 12.4
Hemoglobin: 12.5
Hemoglobin: 13
Hemoglobin: 9.9 — ABNORMAL LOW

## 2011-05-07 LAB — BASIC METABOLIC PANEL
BUN: 6
Chloride: 107
Creatinine, Ser: 0.63
GFR calc non Af Amer: >60

## 2011-05-07 LAB — DIFFERENTIAL
Basophils Absolute: 0
Basophils Absolute: 0.1
Basophils Relative: 1
Lymphocytes Relative: 19
Lymphocytes Relative: 23
Lymphs Abs: 1.5
Monocytes Relative: 10
Neutro Abs: 5.4
Neutro Abs: 5.4
Neutrophils Relative %: 64

## 2011-05-07 LAB — CROSSMATCH

## 2011-05-07 LAB — URINALYSIS, ROUTINE W REFLEX MICROSCOPIC
Bilirubin Urine: NEGATIVE
Glucose, UA: NEGATIVE
Hgb urine dipstick: NEGATIVE
Specific Gravity, Urine: 1.017
pH: 7.5

## 2011-05-07 LAB — URINE CULTURE

## 2011-05-07 LAB — PROTIME-INR: INR: 1

## 2011-05-07 LAB — HEMOGLOBIN: Hemoglobin: 12.9

## 2011-05-07 LAB — IRON AND TIBC
Iron: 81
TIBC: 284

## 2011-05-07 LAB — URINE MICROSCOPIC-ADD ON

## 2011-05-07 LAB — LIPASE, BLOOD: Lipase: 17

## 2011-05-07 LAB — VITAMIN B12: Vitamin B-12: 171 — ABNORMAL LOW (ref 211–911)

## 2011-05-07 LAB — APTT: aPTT: 27

## 2011-05-07 LAB — TSH: TSH: 0.817

## 2011-05-14 LAB — CBC
HCT: 38.8
Hemoglobin: 12.7
Hemoglobin: 12.8
Platelets: 235
Platelets: 256
RBC: 4.25
RDW: 13.9
RDW: 14.1
WBC: 10.6 — ABNORMAL HIGH

## 2011-05-14 LAB — DIFFERENTIAL
Basophils Absolute: 0
Basophils Relative: 0
Eosinophils Absolute: 0.2
Eosinophils Relative: 2
Lymphs Abs: 2.2

## 2011-05-14 LAB — URINALYSIS, ROUTINE W REFLEX MICROSCOPIC
Bilirubin Urine: NEGATIVE
Ketones, ur: NEGATIVE
Nitrite: NEGATIVE
Specific Gravity, Urine: 1.01
Urobilinogen, UA: 1

## 2011-05-14 LAB — COMPREHENSIVE METABOLIC PANEL
ALT: 17
AST: 23
Alkaline Phosphatase: 44
CO2: 28
Chloride: 103
GFR calc Af Amer: >60
GFR calc non Af Amer: >60
Potassium: 4.3
Sodium: 139
Total Bilirubin: 0.8

## 2011-05-14 LAB — BASIC METABOLIC PANEL
BUN: 19
BUN: 22
Calcium: 9.1
Creatinine, Ser: 0.81
GFR calc non Af Amer: 59 — ABNORMAL LOW
GFR calc non Af Amer: >60
Glucose, Bld: 112 — ABNORMAL HIGH
Glucose, Bld: 92
Potassium: 3.9
Sodium: 136

## 2011-05-14 LAB — HEMOGLOBIN A1C: Hgb A1c MFr Bld: 6.3 — ABNORMAL HIGH

## 2011-05-14 LAB — URINE CULTURE: Colony Count: >=100000

## 2011-05-14 LAB — URINE MICROSCOPIC-ADD ON

## 2011-05-21 LAB — COMPREHENSIVE METABOLIC PANEL
ALT: 28 U/L (ref 0–35)
Alkaline Phosphatase: 56 U/L (ref 39–117)
CO2: 33 mEq/L — ABNORMAL HIGH (ref 19–32)
GFR calc non Af Amer: 60 mL/min — ABNORMAL LOW (ref >60)
Glucose, Bld: 139 mg/dL — ABNORMAL HIGH (ref 70–99)
Potassium: 3 mEq/L — ABNORMAL LOW (ref 3.5–5.1)
Sodium: 142 mEq/L (ref 135–145)

## 2011-05-21 LAB — URINALYSIS, ROUTINE W REFLEX MICROSCOPIC
Bilirubin Urine: NEGATIVE
Glucose, UA: NEGATIVE mg/dL
Ketones, ur: NEGATIVE mg/dL
Protein, ur: NEGATIVE mg/dL

## 2011-05-21 LAB — DIFFERENTIAL
Basophils Relative: 0 % (ref 0–1)
Eosinophils Absolute: 0.2 10*3/uL (ref 0.0–0.7)
Monocytes Relative: 13 % — ABNORMAL HIGH (ref 3–12)
Neutrophils Relative %: 57 % (ref 43–77)

## 2011-05-21 LAB — POCT CARDIAC MARKERS
CKMB, poc: 2.1 ng/mL (ref 1.0–8.0)
Myoglobin, poc: 110 ng/mL (ref 12–200)
Myoglobin, poc: 85.1 ng/mL (ref 12–200)

## 2011-05-21 LAB — CBC
Hemoglobin: 13.2 g/dL (ref 12.0–15.0)
RBC: 4.35 MIL/uL (ref 3.87–5.11)

## 2011-05-21 LAB — URINE CULTURE

## 2011-05-21 LAB — LIPASE, BLOOD: Lipase: 51 U/L (ref 23–300)

## 2011-10-16 ENCOUNTER — Emergency Department (INDEPENDENT_AMBULATORY_CARE_PROVIDER_SITE_OTHER): Payer: Medicare Other

## 2011-10-16 ENCOUNTER — Encounter (HOSPITAL_BASED_OUTPATIENT_CLINIC_OR_DEPARTMENT_OTHER): Payer: Self-pay | Admitting: *Deleted

## 2011-10-16 ENCOUNTER — Emergency Department (HOSPITAL_BASED_OUTPATIENT_CLINIC_OR_DEPARTMENT_OTHER)
Admission: EM | Admit: 2011-10-16 | Discharge: 2011-10-16 | Disposition: A | Discharge status: Home / Self Care | Payer: Medicare Other | Attending: Emergency Medicine | Admitting: Emergency Medicine

## 2011-10-16 DIAGNOSIS — R079 Chest pain, unspecified: Secondary | ICD-10-CM

## 2011-10-16 DIAGNOSIS — I10 Essential (primary) hypertension: Secondary | ICD-10-CM | POA: Insufficient documentation

## 2011-10-16 DIAGNOSIS — K219 Gastro-esophageal reflux disease without esophagitis: Secondary | ICD-10-CM | POA: Insufficient documentation

## 2011-10-16 DIAGNOSIS — W19XXXA Unspecified fall, initial encounter: Secondary | ICD-10-CM

## 2011-10-16 DIAGNOSIS — M25559 Pain in unspecified hip: Secondary | ICD-10-CM

## 2011-10-16 DIAGNOSIS — S22080A Wedge compression fracture of T11-T12 vertebra, initial encounter for closed fracture: Secondary | ICD-10-CM

## 2011-10-16 DIAGNOSIS — Z8679 Personal history of other diseases of the circulatory system: Secondary | ICD-10-CM | POA: Insufficient documentation

## 2011-10-16 DIAGNOSIS — S22009A Unspecified fracture of unspecified thoracic vertebra, initial encounter for closed fracture: Secondary | ICD-10-CM | POA: Insufficient documentation

## 2011-10-16 DIAGNOSIS — E079 Disorder of thyroid, unspecified: Secondary | ICD-10-CM | POA: Insufficient documentation

## 2011-10-16 DIAGNOSIS — E785 Hyperlipidemia, unspecified: Secondary | ICD-10-CM | POA: Insufficient documentation

## 2011-10-16 DIAGNOSIS — Z8739 Personal history of other diseases of the musculoskeletal system and connective tissue: Secondary | ICD-10-CM | POA: Insufficient documentation

## 2011-10-16 DIAGNOSIS — I517 Cardiomegaly: Secondary | ICD-10-CM

## 2011-10-16 HISTORY — DX: Cerebral infarction, unspecified: I63.9

## 2011-10-16 HISTORY — DX: Depression, unspecified: F32.A

## 2011-10-16 HISTORY — DX: Disorder of thyroid, unspecified: E07.9

## 2011-10-16 HISTORY — DX: Anxiety disorder, unspecified: F41.9

## 2011-10-16 HISTORY — DX: Unspecified osteoarthritis, unspecified site: M19.90

## 2011-10-16 HISTORY — DX: Hyperlipidemia, unspecified: E78.5

## 2011-10-16 HISTORY — DX: Gastro-esophageal reflux disease without esophagitis: K21.9

## 2011-10-16 HISTORY — DX: Essential (primary) hypertension: I10

## 2011-10-16 HISTORY — DX: Major depressive disorder, single episode, unspecified: F32.9

## 2011-10-16 HISTORY — DX: Hypokalemia: E87.6

## 2011-10-16 HISTORY — DX: Age-related osteoporosis without current pathological fracture: M81.0

## 2011-10-16 HISTORY — DX: Diverticulosis of intestine, part unspecified, without perforation or abscess without bleeding: K57.90

## 2011-10-16 MED ORDER — TRAMADOL HCL 50 MG PO TABS
50.0000 mg | ORAL_TABLET | Freq: Once | ORAL | Status: AC
  Administered 2011-10-16: 50 mg via ORAL
  Filled 2011-10-16: qty 1

## 2011-10-16 MED ORDER — TRAMADOL HCL 50 MG PO TABS: 50.0000 mg | ORAL_TABLET | Freq: Four times a day (QID) | ORAL | Status: AC | PRN

## 2011-10-16 NOTE — ED Notes (Signed)
Pt's family member states she (the pt) fell about a week ago and has been c/o right side pain since. Resides at [facility] and was "checked out" there. Given Tylenol, but still hurting.

## 2011-10-16 NOTE — Discharge Instructions (Signed)
You have a new compression fracture at T12. There is a 50% loss of height. There is minimal retropulsion into the spinal canal. I have discussed these findings with your daughter and your sister. We agree that no neurosurgical consultation if necessary today. If you develop new neurologic symptoms including incontinence of stool, sensory changes in your lower extremities, weakness in her lower extremities, or other emergent concerns he should be seen immediately in an emergency department for reevaluation.  Back, Compression Fracture A compression fracture happens when a force is put upon the length of your spine. Slipping and falling on your bottom are examples of such a force. When this happens, sometimes the force is great enough to compress the building blocks (vertebral bodies) of your spine. Although this causes a lot of pain, this can usually be treated at home, unless your caregiver feels hospitalization is needed for pain control. Your backbone (spinal column) is made up of 24 main vertebral bodies in addition to the sacrum and coccyx (see illustration). These are held together by tough fibrous tissues (ligaments) and by support of your muscles. Nerve roots pass through the openings between the vertebrae. A sudden wrenching move, injury, or a fall may cause a compression fracture of one of the vertebral bodies. This may result in back pain or spread of pain into the belly (abdomen), the buttocks, and down the leg into the foot. Pain may also be created by muscle spasm alone. Large studies have been undertaken to determine the best possible course of action to help your back following injury and also to prevent future problems. The recommendations are as follows. FOLLOWING A COMPRESSION FRACTURE: Do the following only if advised by your caregiver.   If a back brace has been suggested or provided, wear it as directed.   DO NOT stop wearing the back brace unless instructed by your caregiver.    When allowed to return to regular activities, avoid a sedentary life style. Actively exercise. Sporadic weekend binges of tennis, racquetball, water skiing, may actually aggravate or create problems, especially if you are not in condition for that activity.   Avoid sports requiring sudden body movements until you are in condition for them. Swimming and walking are safer activities.   Maintain good posture.   Avoid obesity.   If not already done, you should have a DEXA scan. Based on the results, be treated for osteoporosis.  FOLLOWING ACUTE (SUDDEN) INJURY:  Only take over-the-counter or prescription medicines for pain, discomfort, or fever as directed by your caregiver.   Use bed rest for only the most extreme acute episode. Prolonged bed rest may aggravate your condition. Ice used for acute conditions is effective. Use a large plastic bag filled with ice. Wrap it in a towel. This also provides excellent pain relief. This may be continuous. Or use it for 30 minutes every 2 hours during acute phase, then as needed. Heat for 30 minutes prior to activities is helpful.   As soon as the acute phase (the time when your back is too painful for you to do normal activities) is over, it is important to resume normal activities and work Arboriculturist. Back injuries can cause potentially marked changes in lifestyle. So it is important to attack these problems aggressively.   See your caregiver for continued problems. He or she can help or refer you for appropriate exercises, physical therapy and work hardening if needed.   If you are given narcotic medications for your condition, for the next  24 hours DO NOT:   Drive   Advertising copywriter or power tools.   Sign legal documents.   DO NOT drink alcohol, take sleeping pills or other medications that may interfere with treatment.  If your caregiver has given you a follow-up appointment, it is very important to keep that appointment. Not keeping  the appointment could result in a chronic or permanent injury, pain, and disability. If there is any problem keeping the appointment, you must call back to this facility for assistance.  SEEK IMMEDIATE MEDICAL CARE IF:  You develop numbness, tingling, weakness, or problems with the use of your arms or legs.   You develop severe back pain not relieved with medications.   You have changes in bowel or bladder control.   You have increasing pain in any areas of the body.  Document Released: 08/02/2005 Document Revised: 04/14/2011 Document Reviewed: 03/06/2008 [facility] Patient Information 2012 [place], [place].

## 2011-10-16 NOTE — ED Provider Notes (Signed)
History     CSN: [id number]  Arrival date & time 10/16/11  1401   First MD Initiated Contact with Patient 10/16/11 1421      Chief Complaint  Patient presents with  . Fall    (Consider location/radiation/quality/duration/timing/severity/associated sxs/prior treatment) HPI Patient is an 78 yo female with history of dementia who presents today complaining of persistent right-sided pain since a mechanical fall one week ago. This pain is worse with coughing. Patient had not syncopize. She indicates that her pain is located over the right lower ribs posteriorly. Patient was seen by nursing home facility physician. He recommended lumbar films as well as right hip films. Patient can ambulate since the accident. She is incontinent of urine at baseline and has no new neurologic findings per nursing staff and her sister who is at bedside. She's had no fevers, nausea, or vomiting. There are no other complaints today. Pain is an aching sensation per the patient. History is otherwise limited secondary to patient's dementia. Past Medical History  Diagnosis Date  . Arthritis   . Hypertension   . Thyroid disease   . Stroke   . Diverticulosis   . HLD (hyperlipidemia)   . Anxiety   . Depression   . GERD (gastroesophageal reflux disease)   . OP (osteoporosis)   . Hypokalemia     Past Surgical History  Procedure Date  . Abdominal hysterectomy   . Tonsillectomy     History reviewed. No pertinent family history.  History  Substance Use Topics  . Smoking status: Never Smoker   . Smokeless tobacco: Not on file  . Alcohol Use: No    OB History    Grav Para Term Preterm Abortions TAB SAB Ect Mult Living                  Review of Systems  Constitutional: Negative.   HENT: Negative.   Eyes: Negative.   Respiratory: Negative.   Cardiovascular: Negative.   Gastrointestinal: Negative.   Genitourinary: Negative.   Musculoskeletal:       See history of present illness  Neurological:  Negative.   Hematological: Negative.   Psychiatric/Behavioral: Negative.   All other systems reviewed and are negative.    Allergies  Sulfa antibiotics  Home Medications   Current Outpatient Rx  Name Route Sig Dispense Refill  . ACETAMINOPHEN 325 MG PO TABS Oral Take 650 mg by mouth every 4 (four) hours as needed. For simple pain    . ALENDRONATE SODIUM 70 MG PO TABS Oral Take 70 mg by mouth every 7 (seven) days. Take with a full glass of water on an empty stomach.    . ASPIRIN 81 MG PO TABS Oral Take 81 mg by mouth daily.    Marland Kitchen VITAMIN D3 1000 UNITS PO CAPS Oral Take 1,000 Units by mouth daily.    Marland Kitchen CRANBERRY 425 MG PO CAPS Oral Take 850 mg by mouth daily.    Marland Kitchen EZETIMIBE-SIMVASTATIN 10-40 MG PO TABS Oral Take 1 tablet by mouth daily.    . OMEGA-3 FATTY ACIDS 1000 MG PO CAPS Oral Take 3 g by mouth daily.    Marland Kitchen FLUOXETINE HCL 10 MG PO CAPS Oral Take 30 mg by mouth daily.    Marland Kitchen HYDROCHLOROTHIAZIDE 12.5 MG PO CAPS Oral Take 12.5 mg by mouth daily.    Marland Kitchen LEVOTHYROXINE SODIUM 100 MCG PO TABS Oral Take 100 mcg by mouth daily.    Marland Kitchen LORAZEPAM 0.5 MG PO TABS Oral Take 0.5 mg by  mouth daily.    Marland Kitchen LORAZEPAM 0.5 MG PO TABS Oral Take 0.5 mg by mouth every 8 (eight) hours as needed. As needed for anxiety    . MIRTAZAPINE 7.5 MG PO TABS Oral Take 7.5 mg by mouth at bedtime.    Marland Kitchen ONE-DAILY MULTI VITAMINS PO TABS Oral Take 1 tablet by mouth daily.    Marland Kitchen NADOLOL 40 MG PO TABS Oral Take 40 mg by mouth daily.    Marland Kitchen NITROFURANTOIN MACROCRYSTAL 50 MG PO CAPS Oral Take 50 mg by mouth 2 (two) times daily.    Marland Kitchen OMEPRAZOLE 20 MG PO CPDR Oral Take 20 mg by mouth daily.    Marland Kitchen POTASSIUM CHLORIDE CRYS ER 20 MEQ PO TBCR Oral Take 20 mEq by mouth daily.    Marland Kitchen SOLIFENACIN SUCCINATE 5 MG PO TABS Oral Take 5 mg by mouth daily.      BP 134/62  Pulse 65  Temp(Src) 98.2 F (36.8 C) (Oral)  Resp 20  Ht 5' 3.5" (1.613 m)  Wt 175 lb (79.379 kg)  BMI 30.51 kg/m2  SpO2 96%  Physical Exam  Nursing note and vitals  reviewed. GEN: Well-developed, well-nourished female in no distress HEENT: Atraumatic, normocephalic. Oropharynx clear without erythema EYES: PERRLA BL, no scleral icterus. NECK: Trachea midline, no meningismus CV: regular rate and rhythm. No murmurs, rubs, or gallops PULM: No respiratory distress.  No crackles, wheezes, or rales. GI: soft, non-tender. No guarding, rebound, or tenderness. + bowel sounds  Neuro: cranial nerves 2-12 intact, no abnormalities of strength or sensation, A and O TO SELF AND LOCATION. SHE'S NOT ORIENTED TO YEAR. PATIENT IS AT HER NEUROLOGIC BASELINE PER HER SISTER WHO IS AT BEDSIDE. THERE ARE VARIATIONS IN THE PATIENT'S REPORT OF HER SYMPTOMS.  MSK: Patient moves all 4 extremities symmetrically, no deformity, edema, or injury noted. Patient with mild to palpation over the right hip and right paraspinal musculature between L3 and L5. Patient also with pain on palpation of the right posterior chest wall inferiorly. Psych: no abnormality of mood   ED Course  Procedures (including critical care time)  Labs Reviewed - No data to display Dg Chest 2 View  10/16/2011  *RADIOLOGY REPORT*  Clinical Data: Right-sided pain post fall.  CHEST - 2 VIEW  Comparison: 11/17/2010  Findings: No pneumothorax or effusion.  There is a compression fracture deformity of T12, new since 06/10/2009. Stable moderate cardiomegaly.  Retrocardiac double density suggests hiatal hernia. Coarse interstitial markings with some  improvement in the interstitial edema since previous exam.  IMPRESSION:  1.  New compression fracture deformity of   T12. 2.  Stable cardiomegaly. 3.  Interval improvement in bilateral interstitial edema or infiltrates.  Original Report Authenticated By: Osa Craver, M.D.   Dg Lumbar Spine Complete  10/16/2011  *RADIOLOGY REPORT*  Clinical Data: Pain post fall  LUMBAR SPINE - COMPLETE 4+ VIEW  Comparison: 06/04/2009  Findings: There is a compression fracture deformity of  T12 with approximately 50% loss of height, new since previous exam.  The patient is relatively osteopenic limiting assessment for posterior retropulsion into the spinal canal.  Alignment is preserved. Advanced degenerative disc disease at L1-2, L2-3, L4-5, and L5-S1 as before.  There is a mild dextroscoliosis of the lumbar spine, apex L2, stable.  Extensive aortic calcifications.  IMPRESSION:  1.  T12 vertebral compression fracture, new since previous exam. 2.  Stable degenerative changes L1-S1 as above.  Original Report Authenticated By: Osa Craver, M.D.   Dg  Hip Complete Right  10/16/2011  *RADIOLOGY REPORT*  Clinical Data: Right hip pain post fall  RIGHT HIP - COMPLETE 2+ VIEW  Comparison: 10/02/2007  Findings: Degenerative changes in the visualized lower lumbar spine.  Stable osteitis pubis.  Small marginal spurs from the margins of the acetabula bilaterally, with mild narrowing of the articular cartilage.  Negative for fracture, dislocation, or other acute bony abnormality.  IMPRESSION: 1.  Negative for fracture or other acute abnormality. 2.  Degenerative changes as above.  Original Report Authenticated By: Osa Craver, M.D.   Ct Thoracic Spine Wo Contrast  10/16/2011  *RADIOLOGY REPORT*  Clinical Data: Fall 1 week ago.  Evaluate T12 fracture.  Back pain  CT THORACIC SPINE WITHOUT CONTRAST  Technique:  Multidetector CT imaging of the thoracic spine was performed without intravenous contrast administration. Multiplanar CT image reconstructions were also generated  Comparison: Chest x-ray 10/16/2011  Findings: The CT scanning was limited to the level of the fracture which appears to be T12.  There is a moderately severe  acute fracture of  T12.  There is mild retropulsion of bone into the canal without significant spinal stenosis.  No fracture in the posterior elements is identified.  Pedicles are intact.  IMPRESSION: Moderately severe compression fracture of T12 appears acute.  Mild  retropulsion of bone into the spinal canal without significant spinal stenosis.  Original Report Authenticated By: Camelia Phenes, M.D.     1. T12 compression fracture       MDM  Patient is evaluated today for persistent right-sided pain that is most prominent over the posterior right lower chest wall since a mechanical fall one week ago. She has stable vital signs and no other complaints today. Plain film of the lumbar spine and right hip were performed at the request of the prior evaluating physician and nursing facility. Patient also had chest x-ray performed given the location of her pain on my exam.  Chest x-ray showed T12 fracture of indeterminate age. I spoke with radiologist who felt that CT was necessary to determine whether this was actually acute or not. On reassessment patient still did not have tenderness to palpation over the T12 area. Her sister who is at bedside reports that she is complaining of pain in her back for years. Additionally patient's complaint of pain seems to vary throughout her visit. Patient is at her neurologic baseline with no new incontinence, sensory changes, or extremity weakness. CT was performed and did show that T12 fracture was acute. There is 50% loss of height with mild retropulsion into the spinal canal without significant spinal stenosis. There were no findings to suggest an unstable fracture. I discussed the findings with the patient's sister and daughter via phone as they are her healthcare decision-maker's. We discussed decision not to consult neurosurgery regarding the patient's findings. They were comfortable with this. They were aware that assisted-living facility she be aware that if patient has change in her neurologic status including her ability to ambulate, development of fecal incontinence, or complaints of sensory changes in the lower extremities that the patient should be seen immediately at an emergency department for further evaluation. Patient  was given tramadol for pain and was discharged for this. Patient is discharged in good condition.        Cyndra Numbers, MD 10/16/11 [phone]

## 2011-10-16 NOTE — ED Notes (Signed)
PCP requested lumbar spine and R hip imaged.  Dr Alto Denver informed

## 2014-08-28 ENCOUNTER — Inpatient Hospital Stay (HOSPITAL_COMMUNITY): Payer: Medicare Other

## 2014-08-28 ENCOUNTER — Encounter (HOSPITAL_COMMUNITY): Payer: Self-pay | Admitting: *Deleted

## 2014-08-28 ENCOUNTER — Encounter (HOSPITAL_COMMUNITY): Payer: Self-pay

## 2014-08-28 ENCOUNTER — Inpatient Hospital Stay (HOSPITAL_COMMUNITY): Payer: Medicare Other | Admitting: Certified Registered Nurse Anesthetist

## 2014-08-28 ENCOUNTER — Other Ambulatory Visit: Payer: Self-pay | Admitting: Emergency Medicine

## 2014-08-28 ENCOUNTER — Encounter (HOSPITAL_COMMUNITY)
Admission: EM | Disposition: A | Discharge status: Skilled Nursing Facility | Payer: Self-pay | Source: Home / Self Care | Attending: Internal Medicine

## 2014-08-28 ENCOUNTER — Inpatient Hospital Stay (HOSPITAL_COMMUNITY)
Admission: EM | Admit: 2014-08-28 | Discharge: 2014-09-02 | DRG: 469 | Disposition: A | Discharge status: Skilled Nursing Facility | Payer: Medicare Other | Attending: Internal Medicine | Admitting: Internal Medicine

## 2014-08-28 DIAGNOSIS — J9601 Acute respiratory failure with hypoxia: Secondary | ICD-10-CM | POA: Diagnosis not present

## 2014-08-28 DIAGNOSIS — Z66 Do not resuscitate: Secondary | ICD-10-CM | POA: Diagnosis present

## 2014-08-28 DIAGNOSIS — Z22322 Carrier or suspected carrier of Methicillin resistant Staphylococcus aureus: Secondary | ICD-10-CM | POA: Diagnosis not present

## 2014-08-28 DIAGNOSIS — W19XXXA Unspecified fall, initial encounter: Secondary | ICD-10-CM

## 2014-08-28 DIAGNOSIS — S72002A Fracture of unspecified part of neck of left femur, initial encounter for closed fracture: Secondary | ICD-10-CM

## 2014-08-28 DIAGNOSIS — Z7982 Long term (current) use of aspirin: Secondary | ICD-10-CM | POA: Diagnosis not present

## 2014-08-28 DIAGNOSIS — B961 Klebsiella pneumoniae [K. pneumoniae] as the cause of diseases classified elsewhere: Secondary | ICD-10-CM | POA: Diagnosis not present

## 2014-08-28 DIAGNOSIS — Z9181 History of falling: Secondary | ICD-10-CM | POA: Diagnosis not present

## 2014-08-28 DIAGNOSIS — F0390 Unspecified dementia without behavioral disturbance: Secondary | ICD-10-CM

## 2014-08-28 DIAGNOSIS — S72012A Unspecified intracapsular fracture of left femur, initial encounter for closed fracture: Principal | ICD-10-CM | POA: Diagnosis present

## 2014-08-28 DIAGNOSIS — M810 Age-related osteoporosis without current pathological fracture: Secondary | ICD-10-CM | POA: Diagnosis present

## 2014-08-28 DIAGNOSIS — F419 Anxiety disorder, unspecified: Secondary | ICD-10-CM | POA: Diagnosis present

## 2014-08-28 DIAGNOSIS — N390 Urinary tract infection, site not specified: Secondary | ICD-10-CM | POA: Diagnosis not present

## 2014-08-28 DIAGNOSIS — Z9889 Other specified postprocedural states: Secondary | ICD-10-CM

## 2014-08-28 DIAGNOSIS — F329 Major depressive disorder, single episode, unspecified: Secondary | ICD-10-CM | POA: Diagnosis present

## 2014-08-28 DIAGNOSIS — I2699 Other pulmonary embolism without acute cor pulmonale: Secondary | ICD-10-CM | POA: Diagnosis present

## 2014-08-28 DIAGNOSIS — R0600 Dyspnea, unspecified: Secondary | ICD-10-CM

## 2014-08-28 DIAGNOSIS — Z8673 Personal history of transient ischemic attack (TIA), and cerebral infarction without residual deficits: Secondary | ICD-10-CM

## 2014-08-28 DIAGNOSIS — Y92129 Unspecified place in nursing home as the place of occurrence of the external cause: Secondary | ICD-10-CM

## 2014-08-28 DIAGNOSIS — I10 Essential (primary) hypertension: Secondary | ICD-10-CM | POA: Diagnosis present

## 2014-08-28 DIAGNOSIS — E039 Hypothyroidism, unspecified: Secondary | ICD-10-CM | POA: Diagnosis present

## 2014-08-28 DIAGNOSIS — M1990 Unspecified osteoarthritis, unspecified site: Secondary | ICD-10-CM | POA: Diagnosis present

## 2014-08-28 DIAGNOSIS — E785 Hyperlipidemia, unspecified: Secondary | ICD-10-CM | POA: Diagnosis present

## 2014-08-28 DIAGNOSIS — Z87440 Personal history of urinary (tract) infections: Secondary | ICD-10-CM | POA: Diagnosis not present

## 2014-08-28 DIAGNOSIS — H9190 Unspecified hearing loss, unspecified ear: Secondary | ICD-10-CM | POA: Diagnosis present

## 2014-08-28 DIAGNOSIS — W010XXA Fall on same level from slipping, tripping and stumbling without subsequent striking against object, initial encounter: Secondary | ICD-10-CM | POA: Diagnosis present

## 2014-08-28 DIAGNOSIS — R0902 Hypoxemia: Secondary | ICD-10-CM

## 2014-08-28 DIAGNOSIS — Z96642 Presence of left artificial hip joint: Secondary | ICD-10-CM

## 2014-08-28 DIAGNOSIS — E876 Hypokalemia: Secondary | ICD-10-CM

## 2014-08-28 DIAGNOSIS — K219 Gastro-esophageal reflux disease without esophagitis: Secondary | ICD-10-CM | POA: Diagnosis present

## 2014-08-28 DIAGNOSIS — S72009A Fracture of unspecified part of neck of unspecified femur, initial encounter for closed fracture: Secondary | ICD-10-CM | POA: Diagnosis present

## 2014-08-28 HISTORY — PX: HIP ARTHROPLASTY: SHX981

## 2014-08-28 LAB — CBC WITH DIFFERENTIAL/PLATELET
BASOS ABS: 0 10*3/uL (ref 0.0–0.1)
BASOS PCT: 0 % (ref 0–1)
Eosinophils Absolute: 0.1 10*3/uL (ref 0.0–0.7)
Eosinophils Relative: 0 % (ref 0–5)
HEMATOCRIT: 39.5 % (ref 36.0–46.0)
Hemoglobin: 12.8 g/dL (ref 12.0–15.0)
Lymphocytes Relative: 9 % — ABNORMAL LOW (ref 12–46)
Lymphs Abs: 1.2 10*3/uL (ref 0.7–4.0)
MCH: 28.7 pg (ref 26.0–34.0)
MCHC: 32.4 g/dL (ref 30.0–36.0)
MCV: 88.6 fL (ref 78.0–100.0)
MONO ABS: 0.7 10*3/uL (ref 0.1–1.0)
Monocytes Relative: 5 % (ref 3–12)
NEUTROS ABS: 12 10*3/uL — AB (ref 1.7–7.7)
Neutrophils Relative %: 86 % — ABNORMAL HIGH (ref 43–77)
PLATELETS: 221 10*3/uL (ref 150–400)
RBC: 4.46 MIL/uL (ref 3.87–5.11)
RDW: 14.1 % (ref 11.5–15.5)
WBC: 13.9 10*3/uL — AB (ref 4.0–10.5)

## 2014-08-28 LAB — COMPREHENSIVE METABOLIC PANEL
ALBUMIN: 3.9 g/dL (ref 3.5–5.2)
ALT: 26 U/L (ref 0–35)
ANION GAP: 10 (ref 5–15)
AST: 34 U/L (ref 0–37)
Alkaline Phosphatase: 98 U/L (ref 39–117)
BILIRUBIN TOTAL: 0.6 mg/dL (ref 0.3–1.2)
BUN: 25 mg/dL — ABNORMAL HIGH (ref 6–23)
CO2: 25 mmol/L (ref 19–32)
Calcium: 8.5 mg/dL (ref 8.4–10.5)
Chloride: 102 mEq/L (ref 96–112)
Creatinine, Ser: 0.74 mg/dL (ref 0.50–1.10)
GFR, EST AFRICAN AMERICAN: 85 mL/min — AB (ref >90)
GFR, EST NON AFRICAN AMERICAN: 73 mL/min — AB (ref >90)
GLUCOSE: 173 mg/dL — AB (ref 70–99)
POTASSIUM: 2.8 mmol/L — AB (ref 3.5–5.1)
SODIUM: 137 mmol/L (ref 135–145)
TOTAL PROTEIN: 6.9 g/dL (ref 6.0–8.3)

## 2014-08-28 LAB — BASIC METABOLIC PANEL
Anion gap: 8 (ref 5–15)
BUN: 21 mg/dL (ref 6–23)
CHLORIDE: 103 meq/L (ref 96–112)
CO2: 26 mmol/L (ref 19–32)
Calcium: 8.6 mg/dL (ref 8.4–10.5)
Creatinine, Ser: 0.53 mg/dL (ref 0.50–1.10)
GFR calc Af Amer: >90 mL/min (ref >90)
GFR calc non Af Amer: 82 mL/min — ABNORMAL LOW (ref >90)
GLUCOSE: 149 mg/dL — AB (ref 70–99)
POTASSIUM: 3.1 mmol/L — AB (ref 3.5–5.1)
Sodium: 137 mmol/L (ref 135–145)

## 2014-08-28 LAB — TYPE AND SCREEN
ABO/RH(D): O POS
Antibody Screen: NEGATIVE

## 2014-08-28 LAB — URINALYSIS, ROUTINE W REFLEX MICROSCOPIC
BILIRUBIN URINE: NEGATIVE
GLUCOSE, UA: NEGATIVE mg/dL
KETONES UR: NEGATIVE mg/dL
Nitrite: NEGATIVE
PH: 7.5 (ref 5.0–8.0)
Protein, ur: NEGATIVE mg/dL
Specific Gravity, Urine: 1.012 (ref 1.005–1.030)
Urobilinogen, UA: 0.2 mg/dL (ref 0.0–1.0)

## 2014-08-28 LAB — URINE MICROSCOPIC-ADD ON

## 2014-08-28 LAB — SURGICAL PCR SCREEN
MRSA, PCR: NEGATIVE
Staphylococcus aureus: POSITIVE — AB

## 2014-08-28 LAB — MAGNESIUM: Magnesium: 1.6 mg/dL (ref 1.5–2.5)

## 2014-08-28 SURGERY — HEMIARTHROPLASTY, HIP, DIRECT ANTERIOR APPROACH, FOR FRACTURE
Anesthesia: General | Site: Hip | Laterality: Left

## 2014-08-28 MED ORDER — BISACODYL 5 MG PO TBEC
5.0000 mg | DELAYED_RELEASE_TABLET | Freq: Every day | ORAL | Status: DC | PRN
  Administered 2014-09-02: 5 mg via ORAL
  Filled 2014-08-28: qty 1

## 2014-08-28 MED ORDER — PHENYLEPHRINE HCL 10 MG/ML IJ SOLN
INTRAMUSCULAR | Status: DC | PRN
  Administered 2014-08-28 (×5): 80 ug via INTRAVENOUS

## 2014-08-28 MED ORDER — METHOCARBAMOL 1000 MG/10ML IJ SOLN
500.0000 mg | Freq: Four times a day (QID) | INTRAVENOUS | Status: DC | PRN
  Filled 2014-08-28: qty 5

## 2014-08-28 MED ORDER — FLUOXETINE HCL 20 MG PO CAPS
40.0000 mg | ORAL_CAPSULE | Freq: Every day | ORAL | Status: DC
  Administered 2014-08-29 – 2014-09-02 (×4): 40 mg via ORAL
  Filled 2014-08-28 (×6): qty 2

## 2014-08-28 MED ORDER — MIRTAZAPINE 7.5 MG PO TABS
7.5000 mg | ORAL_TABLET | Freq: Every day | ORAL | Status: DC
  Administered 2014-08-29 – 2014-09-01 (×4): 7.5 mg via ORAL
  Filled 2014-08-28 (×7): qty 1

## 2014-08-28 MED ORDER — HYDROMORPHONE HCL 1 MG/ML IJ SOLN
0.1000 mg | INTRAMUSCULAR | Status: DC | PRN
  Administered 2014-08-30 (×2): 0.1 mg via INTRAVENOUS
  Filled 2014-08-28 (×2): qty 1

## 2014-08-28 MED ORDER — ASPIRIN EC 81 MG PO TBEC
81.0000 mg | DELAYED_RELEASE_TABLET | Freq: Every day | ORAL | Status: DC
  Filled 2014-08-28: qty 1

## 2014-08-28 MED ORDER — ROCURONIUM BROMIDE 50 MG/5ML IV SOLN
INTRAVENOUS | Status: AC
  Filled 2014-08-28: qty 1

## 2014-08-28 MED ORDER — ONDANSETRON HCL 4 MG PO TABS: 4.0000 mg | ORAL_TABLET | Freq: Four times a day (QID) | ORAL | Status: DC | PRN

## 2014-08-28 MED ORDER — LIP MEDEX EX OINT: TOPICAL_OINTMENT | CUTANEOUS | Status: DC | PRN

## 2014-08-28 MED ORDER — ASPIRIN EC 81 MG PO TBEC: 81.0000 mg | DELAYED_RELEASE_TABLET | Freq: Every day | ORAL | Status: DC

## 2014-08-28 MED ORDER — ONDANSETRON HCL 4 MG/2ML IJ SOLN: 4.0000 mg | Freq: Four times a day (QID) | INTRAMUSCULAR | Status: DC | PRN

## 2014-08-28 MED ORDER — MAGNESIUM SULFATE 50 % IJ SOLN
3.0000 g | Freq: Once | INTRAVENOUS | Status: DC
  Filled 2014-08-28: qty 6

## 2014-08-28 MED ORDER — LACTATED RINGERS IV SOLN
INTRAVENOUS | Status: DC
  Administered 2014-08-28: 14:00:00 via INTRAVENOUS

## 2014-08-28 MED ORDER — HYDROCODONE-ACETAMINOPHEN 5-325 MG PO TABS: 1.0000 | ORAL_TABLET | ORAL | Status: DC | PRN

## 2014-08-28 MED ORDER — NEOSTIGMINE METHYLSULFATE 10 MG/10ML IV SOLN
INTRAVENOUS | Status: AC
  Filled 2014-08-28: qty 1

## 2014-08-28 MED ORDER — LORAZEPAM 0.5 MG PO TABS
0.5000 mg | ORAL_TABLET | Freq: Three times a day (TID) | ORAL | Status: DC | PRN
  Administered 2014-08-29: 0.5 mg via ORAL
  Filled 2014-08-28: qty 1

## 2014-08-28 MED ORDER — ALBUTEROL SULFATE (2.5 MG/3ML) 0.083% IN NEBU
2.5000 mg | INHALATION_SOLUTION | Freq: Four times a day (QID) | RESPIRATORY_TRACT | Status: DC | PRN
  Administered 2014-08-28: 2.5 mg via RESPIRATORY_TRACT

## 2014-08-28 MED ORDER — FENTANYL CITRATE 0.05 MG/ML IJ SOLN
INTRAMUSCULAR | Status: AC
  Filled 2014-08-28: qty 2

## 2014-08-28 MED ORDER — POTASSIUM CHLORIDE CRYS ER 20 MEQ PO TBCR
40.0000 meq | EXTENDED_RELEASE_TABLET | ORAL | Status: AC
  Administered 2014-08-28: 40 meq via ORAL
  Filled 2014-08-28: qty 2

## 2014-08-28 MED ORDER — ONDANSETRON HCL 4 MG/2ML IJ SOLN
INTRAMUSCULAR | Status: DC | PRN
  Administered 2014-08-28: 4 mg via INTRAVENOUS

## 2014-08-28 MED ORDER — ONDANSETRON HCL 4 MG/2ML IJ SOLN: 4.0000 mg | Freq: Three times a day (TID) | INTRAMUSCULAR | Status: DC | PRN

## 2014-08-28 MED ORDER — SODIUM CHLORIDE 0.9 % IR SOLN
Status: DC | PRN
  Administered 2014-08-28: 3000 mL

## 2014-08-28 MED ORDER — LEVOTHYROXINE SODIUM 112 MCG PO TABS
112.0000 ug | ORAL_TABLET | Freq: Every day | ORAL | Status: DC
  Administered 2014-08-29 – 2014-09-02 (×4): 112 ug via ORAL
  Filled 2014-08-28 (×8): qty 1

## 2014-08-28 MED ORDER — HYDROCODONE-ACETAMINOPHEN 5-325 MG PO TABS: 1.0000 | ORAL_TABLET | Freq: Four times a day (QID) | ORAL | Status: DC | PRN

## 2014-08-28 MED ORDER — FENTANYL CITRATE 0.05 MG/ML IJ SOLN
INTRAMUSCULAR | Status: AC
  Filled 2014-08-28: qty 5

## 2014-08-28 MED ORDER — TIZANIDINE HCL 2 MG PO CAPS: 2.0000 mg | ORAL_CAPSULE | Freq: Three times a day (TID) | ORAL | Status: DC

## 2014-08-28 MED ORDER — ALBUTEROL SULFATE (2.5 MG/3ML) 0.083% IN NEBU
INHALATION_SOLUTION | RESPIRATORY_TRACT | Status: AC
  Administered 2014-08-28: 2.5 mg via RESPIRATORY_TRACT
  Filled 2014-08-28: qty 3

## 2014-08-28 MED ORDER — PHENYLEPHRINE 40 MCG/ML (10ML) SYRINGE FOR IV PUSH (FOR BLOOD PRESSURE SUPPORT)
PREFILLED_SYRINGE | INTRAVENOUS | Status: AC
  Filled 2014-08-28: qty 30

## 2014-08-28 MED ORDER — ROCURONIUM BROMIDE 100 MG/10ML IV SOLN
INTRAVENOUS | Status: DC | PRN
  Administered 2014-08-28: 30 mg via INTRAVENOUS

## 2014-08-28 MED ORDER — METHOCARBAMOL 500 MG PO TABS
500.0000 mg | ORAL_TABLET | Freq: Four times a day (QID) | ORAL | Status: DC | PRN
  Administered 2014-08-28 – 2014-09-01 (×2): 500 mg via ORAL
  Filled 2014-08-28 (×2): qty 1

## 2014-08-28 MED ORDER — ASPIRIN EC 325 MG PO TBEC
325.0000 mg | DELAYED_RELEASE_TABLET | Freq: Two times a day (BID) | ORAL | Status: DC
  Administered 2014-08-29 – 2014-08-31 (×3): 325 mg via ORAL
  Filled 2014-08-28 (×7): qty 1

## 2014-08-28 MED ORDER — GLYCOPYRROLATE 0.2 MG/ML IJ SOLN
INTRAMUSCULAR | Status: DC | PRN
Start: 2014-08-28 — End: 2014-08-28
  Administered 2014-08-28: 0.4 mg via INTRAVENOUS

## 2014-08-28 MED ORDER — PROPOFOL 10 MG/ML IV BOLUS
INTRAVENOUS | Status: DC | PRN
  Administered 2014-08-28: 90 mg via INTRAVENOUS

## 2014-08-28 MED ORDER — ASPIRIN EC 325 MG PO TBEC: 325.0000 mg | DELAYED_RELEASE_TABLET | Freq: Two times a day (BID) | ORAL | Status: DC

## 2014-08-28 MED ORDER — DOCUSATE SODIUM 100 MG PO CAPS
100.0000 mg | ORAL_CAPSULE | Freq: Two times a day (BID) | ORAL | Status: DC
  Administered 2014-08-29 – 2014-09-02 (×8): 100 mg via ORAL
  Filled 2014-08-28 (×10): qty 1

## 2014-08-28 MED ORDER — PHENOL 1.4 % MT LIQD: 1.0000 | OROMUCOSAL | Status: DC | PRN

## 2014-08-28 MED ORDER — FENTANYL CITRATE 0.05 MG/ML IJ SOLN
INTRAMUSCULAR | Status: DC | PRN
  Administered 2014-08-28: 100 ug via INTRAVENOUS
  Administered 2014-08-28 (×2): 25 ug via INTRAVENOUS

## 2014-08-28 MED ORDER — ONDANSETRON HCL 4 MG/2ML IJ SOLN
INTRAMUSCULAR | Status: AC
  Filled 2014-08-28: qty 2

## 2014-08-28 MED ORDER — CEFAZOLIN SODIUM-DEXTROSE 2-3 GM-% IV SOLR
2.0000 g | INTRAVENOUS | Status: AC
  Administered 2014-08-28: 2 g via INTRAVENOUS
  Filled 2014-08-28: qty 50

## 2014-08-28 MED ORDER — BISOPROLOL-HYDROCHLOROTHIAZIDE 5-6.25 MG PO TABS
1.0000 | ORAL_TABLET | Freq: Every day | ORAL | Status: DC
  Administered 2014-08-28 – 2014-09-02 (×5): 1 via ORAL
  Filled 2014-08-28 (×7): qty 1

## 2014-08-28 MED ORDER — MENTHOL 3 MG MT LOZG: 1.0000 | LOZENGE | OROMUCOSAL | Status: DC | PRN

## 2014-08-28 MED ORDER — FENTANYL CITRATE 0.05 MG/ML IJ SOLN
25.0000 ug | INTRAMUSCULAR | Status: DC | PRN
  Administered 2014-08-28 – 2014-08-30 (×3): 25 ug via INTRAVENOUS
  Filled 2014-08-28 (×3): qty 2

## 2014-08-28 MED ORDER — EPHEDRINE SULFATE 50 MG/ML IJ SOLN
INTRAMUSCULAR | Status: DC | PRN
  Administered 2014-08-28 (×3): 10 mg via INTRAVENOUS
  Administered 2014-08-28: 5 mg via INTRAVENOUS
  Administered 2014-08-28: 10 mg via INTRAVENOUS
  Administered 2014-08-28: 5 mg via INTRAVENOUS

## 2014-08-28 MED ORDER — HYDROCODONE-ACETAMINOPHEN 5-325 MG PO TABS
1.0000 | ORAL_TABLET | Freq: Four times a day (QID) | ORAL | Status: DC | PRN
  Administered 2014-08-28: 1 via ORAL
  Filled 2014-08-28: qty 1

## 2014-08-28 MED ORDER — POTASSIUM CHLORIDE 10 MEQ/100ML IV SOLN
INTRAVENOUS | Status: AC
  Filled 2014-08-28: qty 100

## 2014-08-28 MED ORDER — FLEET ENEMA 7-19 GM/118ML RE ENEM: 1.0000 | ENEMA | Freq: Once | RECTAL | Status: AC | PRN

## 2014-08-28 MED ORDER — PROPOFOL 10 MG/ML IV BOLUS
INTRAVENOUS | Status: AC
  Filled 2014-08-28: qty 20

## 2014-08-28 MED ORDER — ACETAMINOPHEN 325 MG PO TABS
650.0000 mg | ORAL_TABLET | Freq: Four times a day (QID) | ORAL | Status: DC | PRN
  Administered 2014-09-01 (×2): 650 mg via ORAL
  Filled 2014-08-28 (×2): qty 2

## 2014-08-28 MED ORDER — FENTANYL CITRATE 0.05 MG/ML IJ SOLN
25.0000 ug | INTRAMUSCULAR | Status: DC | PRN
  Administered 2014-08-28: 25 ug via INTRAVENOUS

## 2014-08-28 MED ORDER — CHLORHEXIDINE GLUCONATE 4 % EX LIQD
60.0000 mL | Freq: Once | CUTANEOUS | Status: DC
  Filled 2014-08-28 (×2): qty 60

## 2014-08-28 MED ORDER — HYDROCODONE-ACETAMINOPHEN 5-325 MG PO TABS
1.0000 | ORAL_TABLET | Freq: Four times a day (QID) | ORAL | Status: DC | PRN
  Administered 2014-08-29 – 2014-09-01 (×5): 1 via ORAL
  Administered 2014-09-01: 2 via ORAL
  Filled 2014-08-28 (×4): qty 1
  Filled 2014-08-28: qty 2
  Filled 2014-08-28: qty 1

## 2014-08-28 MED ORDER — GLYCOPYRROLATE 0.2 MG/ML IJ SOLN
INTRAMUSCULAR | Status: AC
  Filled 2014-08-28: qty 2

## 2014-08-28 MED ORDER — SENNOSIDES-DOCUSATE SODIUM 8.6-50 MG PO TABS: 1.0000 | ORAL_TABLET | Freq: Every evening | ORAL | Status: DC | PRN

## 2014-08-28 MED ORDER — FENTANYL CITRATE 0.05 MG/ML IJ SOLN: 100.0000 ug | INTRAMUSCULAR | Status: DC | PRN

## 2014-08-28 MED ORDER — METOCLOPRAMIDE HCL 10 MG PO TABS: 5.0000 mg | ORAL_TABLET | Freq: Three times a day (TID) | ORAL | Status: DC | PRN

## 2014-08-28 MED ORDER — DARIFENACIN HYDROBROMIDE ER 7.5 MG PO TB24
7.5000 mg | ORAL_TABLET | Freq: Every day | ORAL | Status: DC
  Administered 2014-08-29 – 2014-09-02 (×4): 7.5 mg via ORAL
  Filled 2014-08-28 (×6): qty 1

## 2014-08-28 MED ORDER — METOCLOPRAMIDE HCL 5 MG/ML IJ SOLN: 5.0000 mg | Freq: Three times a day (TID) | INTRAMUSCULAR | Status: DC | PRN

## 2014-08-28 MED ORDER — SODIUM CHLORIDE 0.9 % IV SOLN
INTRAVENOUS | Status: DC
  Administered 2014-08-28: 13:00:00 via INTRAVENOUS
  Filled 2014-08-28 (×2): qty 1000

## 2014-08-28 MED ORDER — HEPARIN SODIUM (PORCINE) 5000 UNIT/ML IJ SOLN
5000.0000 [IU] | Freq: Three times a day (TID) | INTRAMUSCULAR | Status: DC
  Administered 2014-08-29 – 2014-08-31 (×8): 5000 [IU] via SUBCUTANEOUS
  Filled 2014-08-28 (×14): qty 1

## 2014-08-28 MED ORDER — NEOSTIGMINE METHYLSULFATE 10 MG/10ML IV SOLN
INTRAVENOUS | Status: DC | PRN
  Administered 2014-08-28: 3 mg via INTRAVENOUS

## 2014-08-28 MED ORDER — LIDOCAINE HCL (CARDIAC) 20 MG/ML IV SOLN
INTRAVENOUS | Status: DC | PRN
  Administered 2014-08-28: 40 mg via INTRAVENOUS

## 2014-08-28 MED ORDER — PANTOPRAZOLE SODIUM 40 MG PO TBEC
40.0000 mg | DELAYED_RELEASE_TABLET | Freq: Every day | ORAL | Status: DC
  Administered 2014-08-28 – 2014-09-02 (×5): 40 mg via ORAL
  Filled 2014-08-28 (×4): qty 1

## 2014-08-28 MED ORDER — ACETAMINOPHEN 650 MG RE SUPP
650.0000 mg | Freq: Four times a day (QID) | RECTAL | Status: DC | PRN
  Administered 2014-08-30 (×2): 650 mg via RECTAL
  Filled 2014-08-28 (×2): qty 1

## 2014-08-28 MED ORDER — AMLODIPINE BESYLATE 5 MG PO TABS
5.0000 mg | ORAL_TABLET | Freq: Every day | ORAL | Status: DC
  Administered 2014-08-28 – 2014-09-02 (×5): 5 mg via ORAL
  Filled 2014-08-28 (×6): qty 1

## 2014-08-28 MED ORDER — BUPIVACAINE-EPINEPHRINE 0.5% -1:200000 IJ SOLN
INTRAMUSCULAR | Status: DC | PRN
  Administered 2014-08-28: 20 mL

## 2014-08-28 MED ORDER — BUPIVACAINE-EPINEPHRINE (PF) 0.5% -1:200000 IJ SOLN
INTRAMUSCULAR | Status: AC
  Filled 2014-08-28: qty 30

## 2014-08-28 MED ORDER — KCL IN DEXTROSE-NACL 20-5-0.45 MEQ/L-%-% IV SOLN
INTRAVENOUS | Status: DC
  Administered 2014-08-29 – 2014-08-30 (×3): via INTRAVENOUS
  Filled 2014-08-28 (×6): qty 1000

## 2014-08-28 MED ORDER — LACTATED RINGERS IV SOLN
INTRAVENOUS | Status: DC | PRN
  Administered 2014-08-28 (×2): via INTRAVENOUS

## 2014-08-28 SURGICAL SUPPLY — 67 items
BLADE SAW SGTL MED 73X18.5 STR (BLADE) ×3 IMPLANT
BRUSH FEMORAL CANAL (MISCELLANEOUS) ×3 IMPLANT
CAPT HIP HEMI 1 ×3 IMPLANT
CEMENT BONE DEPUY (Cement) ×6 IMPLANT
CEMENT RESTRICTOR DEPUY SZ 3 (Cement) ×3 IMPLANT
COVER BACK TABLE 24X17X13 BIG (DRAPES) IMPLANT
DRAPE IMP U-DRAPE 54X76 (DRAPES) ×3 IMPLANT
DRAPE ORTHO SPLIT 77X108 STRL (DRAPES) ×4
DRAPE PROXIMA HALF (DRAPES) ×3 IMPLANT
DRAPE SURG ORHT 6 SPLT 77X108 (DRAPES) ×2 IMPLANT
DRAPE U-SHAPE 47X51 STRL (DRAPES) ×3 IMPLANT
DRILL BIT 5/64 (BIT) ×3 IMPLANT
DRSG AQUACEL AG ADV 3.5X10 (GAUZE/BANDAGES/DRESSINGS) ×3 IMPLANT
DRSG PAD ABDOMINAL 8X10 ST (GAUZE/BANDAGES/DRESSINGS) ×3 IMPLANT
DURAPREP 26ML APPLICATOR (WOUND CARE) ×3 IMPLANT
ELECT BLADE 4.0 EZ CLEAN MEGAD (MISCELLANEOUS) ×3
ELECT BLADE 6.5 EXT (BLADE) ×3 IMPLANT
ELECT CAUTERY BLADE 6.4 (BLADE) ×3 IMPLANT
ELECT REM PT RETURN 9FT ADLT (ELECTROSURGICAL) ×3
ELECTRODE BLDE 4.0 EZ CLN MEGD (MISCELLANEOUS) ×1 IMPLANT
ELECTRODE REM PT RTRN 9FT ADLT (ELECTROSURGICAL) ×1 IMPLANT
EVACUATOR 1/8 PVC DRAIN (DRAIN) IMPLANT
GAUZE SPONGE 4X4 12PLY STRL (GAUZE/BANDAGES/DRESSINGS) ×3 IMPLANT
GAUZE XEROFORM 5X9 LF (GAUZE/BANDAGES/DRESSINGS) ×3 IMPLANT
GLOVE BIO SURGEON STRL SZ7.5 (GLOVE) ×3 IMPLANT
GLOVE BIO SURGEON STRL SZ8.5 (GLOVE) ×3 IMPLANT
GLOVE BIOGEL PI IND STRL 8 (GLOVE) ×1 IMPLANT
GLOVE BIOGEL PI IND STRL 9 (GLOVE) ×1 IMPLANT
GLOVE BIOGEL PI INDICATOR 8 (GLOVE) ×2
GLOVE BIOGEL PI INDICATOR 9 (GLOVE) ×2
GOWN STRL REUS W/ TWL LRG LVL3 (GOWN DISPOSABLE) ×1 IMPLANT
GOWN STRL REUS W/ TWL XL LVL3 (GOWN DISPOSABLE) ×3 IMPLANT
GOWN STRL REUS W/TWL LRG LVL3 (GOWN DISPOSABLE) ×2
GOWN STRL REUS W/TWL XL LVL3 (GOWN DISPOSABLE) ×6
HANDPIECE INTERPULSE COAX TIP (DISPOSABLE)
IMMOBILIZER KNEE 20 (SOFTGOODS) IMPLANT
KIT BASIN OR (CUSTOM PROCEDURE TRAY) ×3 IMPLANT
KIT ROOM TURNOVER OR (KITS) ×3 IMPLANT
MANIFOLD NEPTUNE II (INSTRUMENTS) ×3 IMPLANT
NEEDLE 1/2 CIR MAYO (NEEDLE) IMPLANT
NS IRRIG 1000ML POUR BTL (IV SOLUTION) ×3 IMPLANT
PACK TOTAL JOINT (CUSTOM PROCEDURE TRAY) ×3 IMPLANT
PACK UNIVERSAL I (CUSTOM PROCEDURE TRAY) ×3 IMPLANT
PAD ARMBOARD 7.5X6 YLW CONV (MISCELLANEOUS) ×6 IMPLANT
PASSER SUT SWANSON 36MM LOOP (INSTRUMENTS) IMPLANT
PRESSURIZER FEMORAL UNIV (MISCELLANEOUS) ×3 IMPLANT
SET HNDPC FAN SPRY TIP SCT (DISPOSABLE) IMPLANT
STAPLER VISISTAT 35W (STAPLE) ×3 IMPLANT
SUCTION FRAZIER TIP 10 FR DISP (SUCTIONS) IMPLANT
SUT ETHIBOND 2 V 37 (SUTURE) ×3 IMPLANT
SUT ETHILON 3 0 FSL (SUTURE) IMPLANT
SUT PASSER 2.0 195M (MISCELLANEOUS) ×3 IMPLANT
SUT VIC AB 0 CT1 27 (SUTURE) ×2
SUT VIC AB 0 CT1 27XBRD ANBCTR (SUTURE) ×1 IMPLANT
SUT VIC AB 1 CTX 36 (SUTURE) ×2
SUT VIC AB 1 CTX36XBRD ANBCTR (SUTURE) ×1 IMPLANT
SUT VIC AB 2-0 CT1 27 (SUTURE) ×2
SUT VIC AB 2-0 CT1 TAPERPNT 27 (SUTURE) ×1 IMPLANT
SUT VIC AB 3-0 CT1 27 (SUTURE) ×2
SUT VIC AB 3-0 CT1 TAPERPNT 27 (SUTURE) ×1 IMPLANT
SUT VIC AB 3-0 X1 27 (SUTURE) ×3 IMPLANT
SYR CONTROL 10ML LL (SYRINGE) IMPLANT
TOWEL OR 17X24 6PK STRL BLUE (TOWEL DISPOSABLE) ×3 IMPLANT
TOWEL OR 17X26 10 PK STRL BLUE (TOWEL DISPOSABLE) ×3 IMPLANT
TOWER CARTRIDGE SMART MIX (DISPOSABLE) ×3 IMPLANT
TRAY FOLEY CATH 14FR (SET/KITS/TRAYS/PACK) IMPLANT
WATER STERILE IRR 1000ML POUR (IV SOLUTION) ×12 IMPLANT

## 2014-08-28 NOTE — ED Provider Notes (Addendum)
Please see downtime forms.  Kalman Drape, MD 08/28/14 [phone]  Results for orders placed or performed in visit on 08/28/14  Comprehensive metabolic panel  Result Value Ref Range   Sodium 137 135 - 145 mmol/L   Potassium 2.8 (L) 3.5 - 5.1 mmol/L   Chloride 102 96 - 112 mEq/L   CO2 25 19 - 32 mmol/L   Glucose, Bld 173 (H) 70 - 99 mg/dL   BUN 25 (H) 6 - 23 mg/dL   Creatinine, Ser 0.74 0.50 - 1.10 mg/dL   Calcium 8.5 8.4 - 10.5 mg/dL   Total Protein 6.9 6.0 - 8.3 g/dL   Albumin 3.9 3.5 - 5.2 g/dL   AST 34 0 - 37 U/L   ALT 26 0 - 35 U/L   Alkaline Phosphatase 98 39 - 117 U/L   Total Bilirubin 0.6 0.3 - 1.2 mg/dL   GFR calc non Af Amer 73 (L) >90 mL/min   GFR calc Af Amer 85 (L) >90 mL/min   Anion gap 10 5 - 15   No results found. ]   Kalman Drape, MD 08/28/14 [phone]

## 2014-08-28 NOTE — Transfer of Care (Signed)
Immediate Anesthesia Transfer of Care Note  Patient: Brianna Robbins  Procedure(s) Performed: Procedure(s): LEFT HEMI HIP (Left)  Patient Location: PACU  Anesthesia Type:General  Level of Consciousness: responds to stimulation  Airway & Oxygen Therapy: Patient Spontanous Breathing and Patient connected to face mask oxygen  Post-op Assessment: Report given to PACU RN and Post -op Vital signs reviewed and stable  Post vital signs: Reviewed and stable  Complications: No apparent anesthesia complications

## 2014-08-28 NOTE — Anesthesia Postprocedure Evaluation (Signed)
  Anesthesia Post-op Note  Patient: Brianna Robbins  Procedure(s) Performed: Procedure(s): LEFT HEMI HIP (Left)  Patient Location: PACU  Anesthesia Type:General  Level of Consciousness: sedated, patient cooperative and confused  Airway and Oxygen Therapy: Patient Spontanous Breathing  Post-op Pain: mild  Post-op Assessment: Post-op Vital signs reviewed, Patient's Cardiovascular Status Stable, Respiratory Function Stable, Patent Airway, No signs of Nausea or vomiting and Pain level controlled  Post-op Vital Signs: stable  Last Vitals:  Filed Vitals:   08/28/14 1930  BP: 115/49  Pulse: 78  Temp:   Resp: 22    Complications: No apparent anesthesia complications

## 2014-08-28 NOTE — ED Notes (Signed)
Please see paper charting.

## 2014-08-28 NOTE — Anesthesia Procedure Notes (Signed)
Procedure Name: Intubation Date/Time: 08/28/2014 4:40 PM Performed by: Trixie Deis A Pre-anesthesia Checklist: Patient identified, Timeout performed, Emergency Drugs available, Suction available and Patient being monitored Patient Re-evaluated:Patient Re-evaluated prior to inductionOxygen Delivery Method: Circle system utilized Preoxygenation: Pre-oxygenation with 100% oxygen Intubation Type: IV induction Ventilation: Mask ventilation without difficulty and Oral airway inserted - appropriate to patient size Laryngoscope Size: Mac and 3 Grade View: Grade I Tube type: Oral Tube size: 7.0 mm Number of attempts: 1 Airway Equipment and Method: Stylet Placement Confirmation: ETT inserted through vocal cords under direct vision,  breath sounds checked- equal and bilateral and positive ETCO2 Secured at: 21 cm Tube secured with: Tape Dental Injury: Teeth and Oropharynx as per pre-operative assessment

## 2014-08-28 NOTE — Interval H&P Note (Signed)
History and Physical Interval Note:  08/28/2014 3:33 PM  Brianna Robbins  has presented today for surgery, with the diagnosis of LEFT HIP FX  The various methods of treatment have been discussed with the patient and family. After consideration of risks, benefits and other options for treatment, the patient has consented to  Procedure(s): LEFT HEMI HIP (Left) as a surgical intervention .  The patient's history has been reviewed, patient examined, no change in status, stable for surgery.  I have reviewed the patient's chart and labs.  Questions were answered to the patient's satisfaction.     Kerin Salen

## 2014-08-28 NOTE — Anesthesia Preprocedure Evaluation (Addendum)
Anesthesia Evaluation  Patient identified by MRN, date of birth, ID band Patient awake    Reviewed: Allergy & Precautions, NPO status , Patient's Chart, lab work & pertinent test results, reviewed documented beta blocker date and time   History of Anesthesia Complications (+) PROLONGED EMERGENCENegative for: history of anesthetic complications  Airway Mallampati: II  TM Distance: >3 FB Neck ROM: Full    Dental  (+) Poor Dentition, Chipped, Missing, Dental Advisory Given   Pulmonary neg pulmonary ROS,  breath sounds clear to auscultation        Cardiovascular hypertension, Pt. on medications and Pt. on home beta blockers Rhythm:Regular Rate:Normal  Internal Medicine H&P reports EKG with PAC, tracing not available to see   Neuro/Psych PSYCHIATRIC DISORDERS (dementia) Anxiety Depression negative neurological ROS     GI/Hepatic Neg liver ROS, GERD-  Medicated and Controlled,  Endo/Other  Hypothyroidism (on replacement)   Renal/GU negative Renal ROS     Musculoskeletal   Abdominal   Peds  Hematology negative hematology ROS (+)   Anesthesia Other Findings   Reproductive/Obstetrics                         Anesthesia Physical Anesthesia Plan  ASA: III  Anesthesia Plan: General   Post-op Pain Management:    Induction: Intravenous  Airway Management Planned: Oral ETT  Additional Equipment:   Intra-op Plan:   Post-operative Plan: Extubation in OR  Informed Consent: I have reviewed the patients History and Physical, chart, labs and discussed the procedure including the risks, benefits and alternatives for the proposed anesthesia with the patient or authorized representative who has indicated his/her understanding and acceptance.   Dental advisory given  Plan Discussed with: CRNA and Surgeon  Anesthesia Plan Comments: (Plan routine monitors, GETA)        Anesthesia Quick  Evaluation

## 2014-08-28 NOTE — Consult Note (Signed)
Reason for Consult: Left hip fracture Referring Physician: Takiera Mayo is an 81 y.o. female.  HPI: 81 year old female with dementia who lives in a nursing home who had a fall last night with left hip pain. She was seen in the emergency department where x-rays revealed displaced femoral neck fracture. I was consult to for evaluation and management. She has pain in left hip which is worse with movement, better with rest. Denies other musculoskeletal injuries with the fall.  Past Medical History  Diagnosis Date  . Arthritis   . Hypertension   . Thyroid disease   . Stroke   . Diverticulosis   . HLD (hyperlipidemia)   . Anxiety   . Depression   . GERD (gastroesophageal reflux disease)   . OP (osteoporosis)   . Hypokalemia     Past Surgical History  Procedure Laterality Date  . Abdominal hysterectomy    . Tonsillectomy      No family history on file.  Social History:  reports that she has never smoked. She does not have any smokeless tobacco history on file. She reports that she does not drink alcohol or use illicit drugs.  Allergies:  Allergies  Allergen Reactions  . Sulfa Antibiotics Rash    Medications: I have reviewed the patient's current medications.  Results for orders placed or performed in visit on 08/28/14 (from the past 48 hour(s))  Comprehensive metabolic panel     Status: Abnormal   Collection Time: 08/28/14  2:02 AM  Result Value Ref Range   Sodium 137 135 - 145 mmol/L    Comment: Please note change in reference range.   Potassium 2.8 (L) 3.5 - 5.1 mmol/L    Comment: Please note change in reference range.   Chloride 102 96 - 112 mEq/L   CO2 25 19 - 32 mmol/L   Glucose, Bld 173 (H) 70 - 99 mg/dL   BUN 25 (H) 6 - 23 mg/dL   Creatinine, Ser 0.74 0.50 - 1.10 mg/dL   Calcium 8.5 8.4 - 10.5 mg/dL   Total Protein 6.9 6.0 - 8.3 g/dL   Albumin 3.9 3.5 - 5.2 g/dL   AST 34 0 - 37 U/L   ALT 26 0 - 35 U/L   Alkaline Phosphatase 98 39 - 117 U/L   Total  Bilirubin 0.6 0.3 - 1.2 mg/dL   GFR calc non Af Amer 73 (L) >90 mL/min   GFR calc Af Amer 85 (L) >90 mL/min    Comment: (NOTE) The eGFR has been calculated using the CKD EPI equation. This calculation has not been validated in all clinical situations. eGFR's persistently <90 mL/min signify possible Chronic Kidney Disease.    Anion gap 10 5 - 15    Dg Chest 1 View  08/28/2014   CLINICAL DATA:  Status post fall; concern for chest injury. Initial encounter.  EXAM: CHEST - 1 VIEW  COMPARISON:  Chest radiograph and CT of the thoracic spine performed 10/16/2011  FINDINGS: The lungs are mildly hypoexpanded. Vascular crowding and vascular congestion are seen. Minimal bilateral atelectasis is noted. There is no evidence of pleural effusion or pneumothorax.  The cardiomediastinal silhouette is mildly enlarged. No acute osseous abnormalities are seen. Calcification overlying the lateral aspect of the left humeral head is compatible with calcific tendinitis.  IMPRESSION: 1. Lungs mildly hypoexpanded. Vascular congestion and mild cardiomegaly. Mild bilateral atelectasis noted. 2. No displaced rib fracture seen. 3. Changes of calcific tendinitis overlying the left humeral head.   Electronically  Signed   By: Garald Balding M.D.   On: 08/28/2014 04:27   Ct Head Wo Contrast  08/28/2014   CLINICAL DATA:  Status post fall; found on floor. Diffuse headache. Concern for cervical spine injury. Initial encounter.  EXAM: CT HEAD WITHOUT CONTRAST  CT CERVICAL SPINE WITHOUT CONTRAST  TECHNIQUE: Multidetector CT imaging of the head and cervical spine was performed following the standard protocol without intravenous contrast. Multiplanar CT image reconstructions of the cervical spine were also generated.  COMPARISON:  MRI of the brain performed 02/29/2008, and CT of the head performed 02/28/2008  FINDINGS: CT HEAD FINDINGS  There is no evidence of acute infarction, mass lesion, or intra- or extra-axial hemorrhage on CT.   Prominence of the ventricles and sulci reflects mild to moderate cortical volume loss. Mild cerebellar atrophy is noted. Mild periventricular white matter change likely reflects small vessel ischemic microangiopathy.  The brainstem and fourth ventricle are within normal limits. The basal ganglia are unremarkable in appearance. The cerebral hemispheres demonstrate grossly normal gray-white differentiation. No mass effect or midline shift is seen.  There is no evidence of fracture; visualized osseous structures are unremarkable in appearance. The orbits are within normal limits. There is opacification of the right mastoid air cells. The paranasal sinuses and left mastoid air cells are well-aerated. No significant soft tissue abnormalities are seen.  CT CERVICAL SPINE FINDINGS  There is no evidence of acute fracture or subluxation. Vertebral bodies demonstrate normal height. There is mild grade 1 anterolisthesis of C4 on C5, and disc space narrowing is noted at C5-C6, with associated anterior and posterior disc osteophyte complexes. Mild facet disease is noted along the cervical spine. Prevertebral soft tissues are within normal limits.  The thyroid gland is unremarkable in appearance. Mild interstitial prominence is noted at the lung apices. Dense calcification is seen at the carotid bifurcations, with likely moderate bilateral luminal narrowing.  IMPRESSION: 1. No evidence of traumatic intracranial injury or fracture. 2. No evidence of acute fracture or subluxation along the cervical spine. 3. Mild to moderate cortical volume loss and scattered small vessel ischemic microangiopathy. 4. Opacification of the right mastoid air cells. 5. Mild degenerative change along the cervical spine. 6. Mild interstitial prominence at the lung apices. 7. Dense calcification at the carotid bifurcations, with likely moderate bilateral luminal narrowing. Carotid ultrasound would be helpful for further evaluation, when and as deemed  clinically appropriate.   Electronically Signed   By: Garald Balding M.D.   On: 08/28/2014 02:51   Ct Cervical Spine Wo Contrast  08/28/2014   CLINICAL DATA:  Status post fall; found on floor. Diffuse headache. Concern for cervical spine injury. Initial encounter.  EXAM: CT HEAD WITHOUT CONTRAST  CT CERVICAL SPINE WITHOUT CONTRAST  TECHNIQUE: Multidetector CT imaging of the head and cervical spine was performed following the standard protocol without intravenous contrast. Multiplanar CT image reconstructions of the cervical spine were also generated.  COMPARISON:  MRI of the brain performed 02/29/2008, and CT of the head performed 02/28/2008  FINDINGS: CT HEAD FINDINGS  There is no evidence of acute infarction, mass lesion, or intra- or extra-axial hemorrhage on CT.  Prominence of the ventricles and sulci reflects mild to moderate cortical volume loss. Mild cerebellar atrophy is noted. Mild periventricular white matter change likely reflects small vessel ischemic microangiopathy.  The brainstem and fourth ventricle are within normal limits. The basal ganglia are unremarkable in appearance. The cerebral hemispheres demonstrate grossly normal gray-white differentiation. No mass effect or midline shift  is seen.  There is no evidence of fracture; visualized osseous structures are unremarkable in appearance. The orbits are within normal limits. There is opacification of the right mastoid air cells. The paranasal sinuses and left mastoid air cells are well-aerated. No significant soft tissue abnormalities are seen.  CT CERVICAL SPINE FINDINGS  There is no evidence of acute fracture or subluxation. Vertebral bodies demonstrate normal height. There is mild grade 1 anterolisthesis of C4 on C5, and disc space narrowing is noted at C5-C6, with associated anterior and posterior disc osteophyte complexes. Mild facet disease is noted along the cervical spine. Prevertebral soft tissues are within normal limits.  The thyroid  gland is unremarkable in appearance. Mild interstitial prominence is noted at the lung apices. Dense calcification is seen at the carotid bifurcations, with likely moderate bilateral luminal narrowing.  IMPRESSION: 1. No evidence of traumatic intracranial injury or fracture. 2. No evidence of acute fracture or subluxation along the cervical spine. 3. Mild to moderate cortical volume loss and scattered small vessel ischemic microangiopathy. 4. Opacification of the right mastoid air cells. 5. Mild degenerative change along the cervical spine. 6. Mild interstitial prominence at the lung apices. 7. Dense calcification at the carotid bifurcations, with likely moderate bilateral luminal narrowing. Carotid ultrasound would be helpful for further evaluation, when and as deemed clinically appropriate.   Electronically Signed   By: Garald Balding M.D.   On: 08/28/2014 02:51   Dg Foot Complete Right  08/28/2014   CLINICAL DATA:  Status post fall; acute onset of right foot pain. Initial encounter.  EXAM: RIGHT FOOT - 3 VIEWS  COMPARISON:  None.  FINDINGS: There is no evidence of fracture or dislocation. There is chronic flattening of the subtalar joint. There is no evidence of talar subluxation. A prominent posterior calcaneal spur is noted.  Diffuse soft tissue swelling is noted about the distal lower leg.  IMPRESSION: 1. No evidence of fracture or dislocation. 2. Chronic flattening of the subtalar joint noted.   Electronically Signed   By: Garald Balding M.D.   On: 08/28/2014 04:31   Dg Hip Unilat With Pelvis 2-3 Views Left  08/28/2014   CLINICAL DATA:  Status post fall; acute onset of left hip pain. Initial encounter.  EXAM: LEFT HIP - 2+ VIEWS  COMPARISON:  Abdominal radiograph performed 08/12/2008  FINDINGS: There is a displaced subcapital fracture through the left femoral neck, with superior displacement of the distal femur. The left femoral head remains seated at the acetabulum. The right hip joint is grossly  unremarkable.  Mild degenerative change is noted at the pubic symphysis. The sacroiliac joints are unremarkable. Minimal degenerative change is seen along the lower lumbar spine. The visualized bowel gas pattern is grossly unremarkable.  IMPRESSION: Displaced subcapital fracture through the left femoral neck, with superior displacement of the distal femur.   Electronically Signed   By: Garald Balding M.D.   On: 08/28/2014 04:35    Review of Systems  Unable to perform ROS  Blood pressure 172/77, pulse 83, temperature 98.6 F (37 C), temperature source Oral, resp. rate 24, SpO2 90 %. Physical Exam  Constitutional: She appears well-developed.  HENT:  Head: Atraumatic.  Eyes: EOM are normal.  Cardiovascular: Intact distal pulses.   Respiratory: Effort normal.  Musculoskeletal:  LLE shortened an ER.  NVID. TTP L hip  Neurological: She is alert.  Skin: Skin is warm and dry.  Psychiatric: She has a normal mood and affect.    Assessment/Plan: Left displaced femoral  neck fracture Recommend left hip hemiarthroplasty to promote early ambulation, prevent complications of bed rest and promote pain relief. This was discussed with the patient and her sister at the bedside. They agree and would like to go forward with surgery. Dr. Mayer Camel, a joint replacement specialist, will be able to perform her surgery this afternoon around 2:30 or 3. I think this will be best for the patient. She will remain nothing by mouth and will be transferred to [facility] on a hospitalist service.   Nita Sells 08/28/2014, 7:28 AM

## 2014-08-28 NOTE — Op Note (Signed)
Pre Op Dx: L  femoral neck fracture   Post Op Dx: Same   Procedure: L hemi-hip arthroplasty using DePuy 46 mm monopolar head, +0 neck, #4 Summit basic stem, 11 mm tip, #3 cement restrictor, double batch Depuy 1 cement with 1500 mg of Zinacef.  Surgeon: Kerin Salen, MD  Assistant: Kerry Hough. Barton Dubois  (present throughout entire procedure and necessary for timely completion of the procedure)  Anesthesia: General  EBL: 300 cc  Fluids: 1500 cc of crystalloid  Tourniquet Time: Not applicable  Indications: Independent living patient slipped and fell at home on the evening of 01/01/2012. Was transported to the emergency room with a presumed L hip fracture confirmed by x-ray. To decrease pain increase function and decreased morbidity have recommended and the patient has consented to hemi-hip arthroplasty. Risks and benefits of surgery have been discussed with the patient and family. All questions have been answered.  Procedure: Patient was identified by arm band receive preoperative IV antibiotics in the holding area at the hospital. She was taken to operating room for the appropriate anesthetic monitors were attached and general LMA anesthesia induced with the patient in the supine position. She was then rolled into the R lateral decubitus position and fixed there with a pelvic clamp. The L lower extremity was then prepped and draped in usual sterile fashion from the ankle to the hemipelvis and a timeout procedure performed. A 12 cm incision centered over the greater trochanter allowing a lateral to posterior lateral approach to the hip was then made through the skin and subcutaneous tissue. The IT band was cut in line with skin incision at which point we encountered hematoma. Cobra retractors were placed on the superior hip joint capsule and along the inferior hip joint capsule as well. The Piriformis and short external rotators were tagged and cut after insertion on the intertrochanteric crest and a  posterior capsular flap was developed going posterior superior off the acetabulum out over the fractured femoral neck exiting posterior inferior. This exposed the fracture, the hip was internally rotated and a standard neck cut was performed with an oscillating saw 1 fingerbreadth above the lesser trochanter. This allowed [place] to extracted the femoral head, which measured at 46 mm. Trial reductions were then performed with a 46 and 47 mm trial head on a stick and the 46 mm head had the best fit and fill. There was then flexed and internally rotated exposing the proximal femur which was entered with the initiating reamer, the lateral reamer and then broaches up to a #4 broach which had the best fit and fill to the neck cut. The neck cut was then finished with the neck reamer. And a +0 46 mm trial head was placed on the broach, the hip reduced and stability was found to be excellent he could flex to 90 and 75 of internal rotation and in full extension the hip could not be dislocated in external rotation. We then size for a #3 cement restrictor which was inserted without difficulty and the proximal femur was pulse device clean and dried with suction and sponges. A double batch of DePuy 1 cement was then mixed with 1500 mg of Zinacef and injected into the proximal femur under pressure followed by a #4 Summit basic stem with a 11 mm cementralizer. The stem was held in compression and about 15 of anteversion as the cement cured. A +0 46 mm monopolar head was then hammered onto the stem the hip reduced and stability again  noted to be excellent. The wound is thoroughly irrigated out normal saline solution. Capsular flap and short external rotators repaired back to the intertrochanteric crest a drill holes with a #2 Ethibond suture. The IT band was closed with running #1 Vicryl suture, the subcutaneous tissue with 0 and 2-0 undyed Vicryl suture and the skin with running interlocking 3-0 nylon suture. A dressing of Mepilex  was then applied. The patient was unclamped rolled supine, awakened extubated and taken to the recovery room without difficulty.

## 2014-08-28 NOTE — H&P (Addendum)
[facility] History and Physical  Patient: Brianna Robbins  DTO:[id number]  DOB: 02/23/1923  DOS: the patient was seen and examined on 08/28/2014 PCP: No primary care provider on file.  Chief Complaint: Fall  HPI: Brianna Robbins is a 81 y.o. female with Past medical history of arthritis, essential hypertension, dementia, recurrent fall, diverticular bleeding, dementia. The patient is a resident of an assisted living facility and today she comes in with a fall. The fall was not witnessed and the patient is unable to provide any history due to dementia. Patient was seen earlier by her sister during the day and at which time she did not have any acute complaints. There is no reported history of medication change, fever, chills, nausea, vomiting, diarrhea. Patient did not have any cough or shortness of breath prior to this episode as well. Patient the time of my evaluation denies any complaint of headache, neck pain, focal deficit, chest pain, abdominal pain. Patient denies any burning urination. Prior to the fall patient's daughter was also discussing with the patient on the phone and she also was supposed to be seen by a CNA today as it was her day of bathing.  The patient is coming from assisted living facility. And at her baseline dependent for most of her ADL.  Review of Systems: as mentioned in the history of present illness.  A Comprehensive review of the other systems is negative.  Past Medical History  Diagnosis Date  . Arthritis   . Hypertension   . Thyroid disease   . Stroke   . Diverticulosis   . HLD (hyperlipidemia)   . Anxiety   . Depression   . GERD (gastroesophageal reflux disease)   . OP (osteoporosis)   . Hypokalemia    Past Surgical History  Procedure Laterality Date  . Abdominal hysterectomy    . Tonsillectomy     Social History:  reports that she has never smoked. She does not have any smokeless tobacco history on file. She reports that she does not drink  alcohol or use illicit drugs.  Allergies  Allergen Reactions  . Sulfa Antibiotics Rash    No family history on file.  Prior to Admission medications   Medication Sig Start Date End Date Taking? Authorizing Provider  acetaminophen (TYLENOL) 325 MG tablet Take 650 mg by mouth 3 (three) times daily. 8 am, 1 pm, & 8 pm   Yes Historical Provider, MD  amLODipine (NORVASC) 5 MG tablet Take 5 mg by mouth daily.   Yes Historical Provider, MD  aspirin EC 81 MG tablet Take 81 mg by mouth daily.   Yes Historical Provider, MD  bisoprolol-hydrochlorothiazide (ZIAC) 5-6.25 MG per tablet Take 1 tablet by mouth daily.   Yes Historical Provider, MD  Cholecalciferol (VITAMIN D3) 1000 UNITS CAPS Take 1,000 Units by mouth daily.   Yes Historical Provider, MD  Cranberry 425 MG CAPS Take 850 mg by mouth 2 (two) times daily.    Yes Historical Provider, MD  FLUoxetine (PROZAC) 40 MG capsule Take 40 mg by mouth daily.   Yes Historical Provider, MD  levothyroxine (SYNTHROID, LEVOTHROID) 112 MCG tablet Take 112 mcg by mouth daily before breakfast.   Yes Historical Provider, MD  LORazepam (ATIVAN) 0.5 MG tablet Take 0.5 mg by mouth every 8 (eight) hours as needed. As needed for anxiety   Yes Historical Provider, MD  mirtazapine (REMERON) 7.5 MG tablet Take 7.5 mg by mouth at bedtime.   Yes Historical Provider, MD  omeprazole ([place])  20 MG capsule Take 20 mg by mouth daily.   Yes Historical Provider, MD  Probiotic Product (PROBIOTIC PO) Take 1 capsule by mouth daily.   Yes Historical Provider, MD  solifenacin (VESICARE) 5 MG tablet Take 5 mg by mouth daily.   Yes Historical Provider, MD  traMADol (ULTRAM) 50 MG tablet Take 50 mg by mouth every 6 (six) hours as needed (for pain.).   Yes Historical Provider, MD    Physical Exam: Filed Vitals:   08/28/14 0530 08/28/14 0600 08/28/14 0630  BP: 166/77 156/72 168/78  Pulse: 82 85 87  Resp: 27 28 28   SpO2: 91% 91% 90%    General: Alert, Awake and Oriented to Time,  Place and Person. Appear in mild distress Eyes: PERRL ENT: Oral Mucosa clear moist. Neck: no JVD Cardiovascular: S1 and S2 Present, no Murmur, Peripheral Pulses Present Respiratory: Bilateral Air entry equal and Decreased, Clear to Auscultation, noCrackles, no wheezes Abdomen: Bowel Sound present, Soft and no tender Skin: no Rash Extremities: no Pedal edema, no calf tenderness, right dorsum of the foot discoloration Neurologic: Grossly no focal neuro deficit.  Labs on Admission:  CBC:  Recent Labs Lab 08/28/14 0202  WBC 13.9*  NEUTROABS 12.0*  HGB 12.8  HCT 39.5  MCV 88.6  PLT 221    CMP     Component Value Date/Time   NA 137 08/28/2014 0202   K 2.8* 08/28/2014 0202   CL 102 08/28/2014 0202   CO2 25 08/28/2014 0202   GLUCOSE 173* 08/28/2014 0202   BUN 25* 08/28/2014 0202   CREATININE 0.74 08/28/2014 0202   CALCIUM 8.5 08/28/2014 0202   PROT 6.9 08/28/2014 0202   ALBUMIN 3.9 08/28/2014 0202   AST 34 08/28/2014 0202   ALT 26 08/28/2014 0202   ALKPHOS 98 08/28/2014 0202   BILITOT 0.6 08/28/2014 0202   GFRNONAA 73* 08/28/2014 0202   GFRAA 85* 08/28/2014 0202    No results for input(s): LIPASE, AMYLASE in the last 168 hours. No results for input(s): AMMONIA in the last 168 hours.  No results for input(s): CKTOTAL, CKMB, CKMBINDEX, TROPONINI in the last 168 hours. BNP (last 3 results) No results for input(s): PROBNP in the last 8760 hours.  Radiological Exams on Admission: Dg Chest 1 View  08/28/2014   CLINICAL DATA:  Status post fall; concern for chest injury. Initial encounter.  EXAM: CHEST - 1 VIEW  COMPARISON:  Chest radiograph and CT of the thoracic spine performed 10/16/2011  FINDINGS: The lungs are mildly hypoexpanded. Vascular crowding and vascular congestion are seen. Minimal bilateral atelectasis is noted. There is no evidence of pleural effusion or pneumothorax.  The cardiomediastinal silhouette is mildly enlarged. No acute osseous abnormalities are seen.  Calcification overlying the lateral aspect of the left humeral head is compatible with calcific tendinitis.  IMPRESSION: 1. Lungs mildly hypoexpanded. Vascular congestion and mild cardiomegaly. Mild bilateral atelectasis noted. 2. No displaced rib fracture seen. 3. Changes of calcific tendinitis overlying the left humeral head.   Electronically Signed   By: Garald Balding M.D.   On: 08/28/2014 04:27   Ct Head Wo Contrast  08/28/2014   CLINICAL DATA:  Status post fall; found on floor. Diffuse headache. Concern for cervical spine injury. Initial encounter.  EXAM: CT HEAD WITHOUT CONTRAST  CT CERVICAL SPINE WITHOUT CONTRAST  TECHNIQUE: Multidetector CT imaging of the head and cervical spine was performed following the standard protocol without intravenous contrast. Multiplanar CT image reconstructions of the cervical spine were also generated.  COMPARISON:  MRI of the brain performed 02/29/2008, and CT of the head performed 02/28/2008  FINDINGS: CT HEAD FINDINGS  There is no evidence of acute infarction, mass lesion, or intra- or extra-axial hemorrhage on CT.  Prominence of the ventricles and sulci reflects mild to moderate cortical volume loss. Mild cerebellar atrophy is noted. Mild periventricular white matter change likely reflects small vessel ischemic microangiopathy.  The brainstem and fourth ventricle are within normal limits. The basal ganglia are unremarkable in appearance. The cerebral hemispheres demonstrate grossly normal gray-white differentiation. No mass effect or midline shift is seen.  There is no evidence of fracture; visualized osseous structures are unremarkable in appearance. The orbits are within normal limits. There is opacification of the right mastoid air cells. The paranasal sinuses and left mastoid air cells are well-aerated. No significant soft tissue abnormalities are seen.  CT CERVICAL SPINE FINDINGS  There is no evidence of acute fracture or subluxation. Vertebral bodies demonstrate  normal height. There is mild grade 1 anterolisthesis of C4 on C5, and disc space narrowing is noted at C5-C6, with associated anterior and posterior disc osteophyte complexes. Mild facet disease is noted along the cervical spine. Prevertebral soft tissues are within normal limits.  The thyroid gland is unremarkable in appearance. Mild interstitial prominence is noted at the lung apices. Dense calcification is seen at the carotid bifurcations, with likely moderate bilateral luminal narrowing.  IMPRESSION: 1. No evidence of traumatic intracranial injury or fracture. 2. No evidence of acute fracture or subluxation along the cervical spine. 3. Mild to moderate cortical volume loss and scattered small vessel ischemic microangiopathy. 4. Opacification of the right mastoid air cells. 5. Mild degenerative change along the cervical spine. 6. Mild interstitial prominence at the lung apices. 7. Dense calcification at the carotid bifurcations, with likely moderate bilateral luminal narrowing. Carotid ultrasound would be helpful for further evaluation, when and as deemed clinically appropriate.   Electronically Signed   By: Garald Balding M.D.   On: 08/28/2014 02:51   Ct Cervical Spine Wo Contrast  08/28/2014   CLINICAL DATA:  Status post fall; found on floor. Diffuse headache. Concern for cervical spine injury. Initial encounter.  EXAM: CT HEAD WITHOUT CONTRAST  CT CERVICAL SPINE WITHOUT CONTRAST  TECHNIQUE: Multidetector CT imaging of the head and cervical spine was performed following the standard protocol without intravenous contrast. Multiplanar CT image reconstructions of the cervical spine were also generated.  COMPARISON:  MRI of the brain performed 02/29/2008, and CT of the head performed 02/28/2008  FINDINGS: CT HEAD FINDINGS  There is no evidence of acute infarction, mass lesion, or intra- or extra-axial hemorrhage on CT.  Prominence of the ventricles and sulci reflects mild to moderate cortical volume loss. Mild  cerebellar atrophy is noted. Mild periventricular white matter change likely reflects small vessel ischemic microangiopathy.  The brainstem and fourth ventricle are within normal limits. The basal ganglia are unremarkable in appearance. The cerebral hemispheres demonstrate grossly normal gray-white differentiation. No mass effect or midline shift is seen.  There is no evidence of fracture; visualized osseous structures are unremarkable in appearance. The orbits are within normal limits. There is opacification of the right mastoid air cells. The paranasal sinuses and left mastoid air cells are well-aerated. No significant soft tissue abnormalities are seen.  CT CERVICAL SPINE FINDINGS  There is no evidence of acute fracture or subluxation. Vertebral bodies demonstrate normal height. There is mild grade 1 anterolisthesis of C4 on C5, and disc space narrowing is noted at  C5-C6, with associated anterior and posterior disc osteophyte complexes. Mild facet disease is noted along the cervical spine. Prevertebral soft tissues are within normal limits.  The thyroid gland is unremarkable in appearance. Mild interstitial prominence is noted at the lung apices. Dense calcification is seen at the carotid bifurcations, with likely moderate bilateral luminal narrowing.  IMPRESSION: 1. No evidence of traumatic intracranial injury or fracture. 2. No evidence of acute fracture or subluxation along the cervical spine. 3. Mild to moderate cortical volume loss and scattered small vessel ischemic microangiopathy. 4. Opacification of the right mastoid air cells. 5. Mild degenerative change along the cervical spine. 6. Mild interstitial prominence at the lung apices. 7. Dense calcification at the carotid bifurcations, with likely moderate bilateral luminal narrowing. Carotid ultrasound would be helpful for further evaluation, when and as deemed clinically appropriate.   Electronically Signed   By: Garald Balding M.D.   On: 08/28/2014 02:51    Dg Foot Complete Right  08/28/2014   CLINICAL DATA:  Status post fall; acute onset of right foot pain. Initial encounter.  EXAM: RIGHT FOOT - 3 VIEWS  COMPARISON:  None.  FINDINGS: There is no evidence of fracture or dislocation. There is chronic flattening of the subtalar joint. There is no evidence of talar subluxation. A prominent posterior calcaneal spur is noted.  Diffuse soft tissue swelling is noted about the distal lower leg.  IMPRESSION: 1. No evidence of fracture or dislocation. 2. Chronic flattening of the subtalar joint noted.   Electronically Signed   By: Garald Balding M.D.   On: 08/28/2014 04:31   Dg Hip Unilat With Pelvis 2-3 Views Left  08/28/2014   CLINICAL DATA:  Status post fall; acute onset of left hip pain. Initial encounter.  EXAM: LEFT HIP - 2+ VIEWS  COMPARISON:  Abdominal radiograph performed 08/12/2008  FINDINGS: There is a displaced subcapital fracture through the left femoral neck, with superior displacement of the distal femur. The left femoral head remains seated at the acetabulum. The right hip joint is grossly unremarkable.  Mild degenerative change is noted at the pubic symphysis. The sacroiliac joints are unremarkable. Minimal degenerative change is seen along the lower lumbar spine. The visualized bowel gas pattern is grossly unremarkable.  IMPRESSION: Displaced subcapital fracture through the left femoral neck, with superior displacement of the distal femur.   Electronically Signed   By: Garald Balding M.D.   On: 08/28/2014 04:35    EKG: Independently reviewed. normal sinus rhythm, PAC's noted.  Assessment/Plan Principal Problem:   Left displaced femoral neck fracture Active Problems:   Closed left hip fracture   Essential hypertension   Hypoxia   Hypokalemia   Dementia   HOH (hard of hearing)   1. Left displaced femoral neck fracture The patient is presenting with complaints of fall. The fall is not witnessed. But the patient does have history of  recurrent falls in the past due to balance issues and has been walking with a walker at her baseline. At the time of my evaluation the patient does not have any focal deficit. Orthopedic has been consulted and Dr. Tamera Punt will see the patient. 1) Cardiac risk: Based on RCRI  Patient does not have any significant high risk factor  With this the patient is a intermediate risk for adverse Cardiac outcome from surgery due to her age. the risks and benefits were discussed and acceptable to patient/family. hold aspirin, and resume tomorrow Hold lisinopril and diuretics.  2) Pulmonary risk: The pt does not  have history of COPD Asthma ILD Her chest x-ray shows hypoventilation causing her to have hypoxia  Recommend use of PRN nebulizer, and optimization of lund function with use of inhalers and incentive spirometry. Good pulmunary toilet.  3) General risk: Minimal sedation and Narcotics to avoid delirium in demented patient  Will request Surgeon to please Order Lovenox/DVT prophylaxis of his/her choice when OK from Surgeon's standpoint post op.   2. Hypertension. Continue amlodipine.  3. Hypokalemia. Patient has received replacement potassium. Recheck potassium.  4. Dementia. At present not having any behavioral disturbances continue to closely monitor for signs of delirium.  Advance goals of care discussion:  DNR/DNI   Consults:  Orthopedics  DVT Prophylaxis: subcutaneous Heparin Nutrition:  Nothing by mouth except medication  Family Communication:  Sister was present at bedside, opportunity was given to ask question and all questions were answered satisfactorily at the time of interview. Disposition: Admitted to inpatient in med-surge unit.  Author: Berle Mull, MD [facility] Pager: [phone] 08/28/2014, 7:06 AM    Addendum: Pt will be transferred to [facility] for surgery per surgery.   Puneet Selden 7:42 AM 08/28/2014   If 7PM-7AM, please contact  night-coverage [email or web address] Password [email or web address]

## 2014-08-28 NOTE — Progress Notes (Signed)
I have seen and assessed patient and agree with Dr Serita Grit assessment and plan. Repeat BMET with potassium of 3.1. Will replete potassium and keep magnesium > 2. Gentle hydration. Patient to be transferred to [facility] for surgery.

## 2014-08-28 NOTE — H&P (View-Only) (Signed)
Reason for Consult: Left hip fracture Referring Physician: Takiera Mayo is an 81 y.o. female.  HPI: 81 year old female with dementia who lives in a nursing home who had a fall last night with left hip pain. She was seen in the emergency department where x-rays revealed displaced femoral neck fracture. I was consult to for evaluation and management. She has pain in left hip which is worse with movement, better with rest. Denies other musculoskeletal injuries with the fall.  Past Medical History  Diagnosis Date  . Arthritis   . Hypertension   . Thyroid disease   . Stroke   . Diverticulosis   . HLD (hyperlipidemia)   . Anxiety   . Depression   . GERD (gastroesophageal reflux disease)   . OP (osteoporosis)   . Hypokalemia     Past Surgical History  Procedure Laterality Date  . Abdominal hysterectomy    . Tonsillectomy      No family history on file.  Social History:  reports that she has never smoked. She does not have any smokeless tobacco history on file. She reports that she does not drink alcohol or use illicit drugs.  Allergies:  Allergies  Allergen Reactions  . Sulfa Antibiotics Rash    Medications: I have reviewed the patient's current medications.  Results for orders placed or performed in visit on 08/28/14 (from the past 48 hour(s))  Comprehensive metabolic panel     Status: Abnormal   Collection Time: 08/28/14  2:02 AM  Result Value Ref Range   Sodium 137 135 - 145 mmol/L    Comment: Please note change in reference range.   Potassium 2.8 (L) 3.5 - 5.1 mmol/L    Comment: Please note change in reference range.   Chloride 102 96 - 112 mEq/L   CO2 25 19 - 32 mmol/L   Glucose, Bld 173 (H) 70 - 99 mg/dL   BUN 25 (H) 6 - 23 mg/dL   Creatinine, Ser 0.74 0.50 - 1.10 mg/dL   Calcium 8.5 8.4 - 10.5 mg/dL   Total Protein 6.9 6.0 - 8.3 g/dL   Albumin 3.9 3.5 - 5.2 g/dL   AST 34 0 - 37 U/L   ALT 26 0 - 35 U/L   Alkaline Phosphatase 98 39 - 117 U/L   Total  Bilirubin 0.6 0.3 - 1.2 mg/dL   GFR calc non Af Amer 73 (L) >90 mL/min   GFR calc Af Amer 85 (L) >90 mL/min    Comment: (NOTE) The eGFR has been calculated using the CKD EPI equation. This calculation has not been validated in all clinical situations. eGFR's persistently <90 mL/min signify possible Chronic Kidney Disease.    Anion gap 10 5 - 15    Dg Chest 1 View  08/28/2014   CLINICAL DATA:  Status post fall; concern for chest injury. Initial encounter.  EXAM: CHEST - 1 VIEW  COMPARISON:  Chest radiograph and CT of the thoracic spine performed 10/16/2011  FINDINGS: The lungs are mildly hypoexpanded. Vascular crowding and vascular congestion are seen. Minimal bilateral atelectasis is noted. There is no evidence of pleural effusion or pneumothorax.  The cardiomediastinal silhouette is mildly enlarged. No acute osseous abnormalities are seen. Calcification overlying the lateral aspect of the left humeral head is compatible with calcific tendinitis.  IMPRESSION: 1. Lungs mildly hypoexpanded. Vascular congestion and mild cardiomegaly. Mild bilateral atelectasis noted. 2. No displaced rib fracture seen. 3. Changes of calcific tendinitis overlying the left humeral head.   Electronically  Signed   By: Garald Balding M.D.   On: 08/28/2014 04:27   Ct Head Wo Contrast  08/28/2014   CLINICAL DATA:  Status post fall; found on floor. Diffuse headache. Concern for cervical spine injury. Initial encounter.  EXAM: CT HEAD WITHOUT CONTRAST  CT CERVICAL SPINE WITHOUT CONTRAST  TECHNIQUE: Multidetector CT imaging of the head and cervical spine was performed following the standard protocol without intravenous contrast. Multiplanar CT image reconstructions of the cervical spine were also generated.  COMPARISON:  MRI of the brain performed 02/29/2008, and CT of the head performed 02/28/2008  FINDINGS: CT HEAD FINDINGS  There is no evidence of acute infarction, mass lesion, or intra- or extra-axial hemorrhage on CT.   Prominence of the ventricles and sulci reflects mild to moderate cortical volume loss. Mild cerebellar atrophy is noted. Mild periventricular white matter change likely reflects small vessel ischemic microangiopathy.  The brainstem and fourth ventricle are within normal limits. The basal ganglia are unremarkable in appearance. The cerebral hemispheres demonstrate grossly normal gray-white differentiation. No mass effect or midline shift is seen.  There is no evidence of fracture; visualized osseous structures are unremarkable in appearance. The orbits are within normal limits. There is opacification of the right mastoid air cells. The paranasal sinuses and left mastoid air cells are well-aerated. No significant soft tissue abnormalities are seen.  CT CERVICAL SPINE FINDINGS  There is no evidence of acute fracture or subluxation. Vertebral bodies demonstrate normal height. There is mild grade 1 anterolisthesis of C4 on C5, and disc space narrowing is noted at C5-C6, with associated anterior and posterior disc osteophyte complexes. Mild facet disease is noted along the cervical spine. Prevertebral soft tissues are within normal limits.  The thyroid gland is unremarkable in appearance. Mild interstitial prominence is noted at the lung apices. Dense calcification is seen at the carotid bifurcations, with likely moderate bilateral luminal narrowing.  IMPRESSION: 1. No evidence of traumatic intracranial injury or fracture. 2. No evidence of acute fracture or subluxation along the cervical spine. 3. Mild to moderate cortical volume loss and scattered small vessel ischemic microangiopathy. 4. Opacification of the right mastoid air cells. 5. Mild degenerative change along the cervical spine. 6. Mild interstitial prominence at the lung apices. 7. Dense calcification at the carotid bifurcations, with likely moderate bilateral luminal narrowing. Carotid ultrasound would be helpful for further evaluation, when and as deemed  clinically appropriate.   Electronically Signed   By: Garald Balding M.D.   On: 08/28/2014 02:51   Ct Cervical Spine Wo Contrast  08/28/2014   CLINICAL DATA:  Status post fall; found on floor. Diffuse headache. Concern for cervical spine injury. Initial encounter.  EXAM: CT HEAD WITHOUT CONTRAST  CT CERVICAL SPINE WITHOUT CONTRAST  TECHNIQUE: Multidetector CT imaging of the head and cervical spine was performed following the standard protocol without intravenous contrast. Multiplanar CT image reconstructions of the cervical spine were also generated.  COMPARISON:  MRI of the brain performed 02/29/2008, and CT of the head performed 02/28/2008  FINDINGS: CT HEAD FINDINGS  There is no evidence of acute infarction, mass lesion, or intra- or extra-axial hemorrhage on CT.  Prominence of the ventricles and sulci reflects mild to moderate cortical volume loss. Mild cerebellar atrophy is noted. Mild periventricular white matter change likely reflects small vessel ischemic microangiopathy.  The brainstem and fourth ventricle are within normal limits. The basal ganglia are unremarkable in appearance. The cerebral hemispheres demonstrate grossly normal gray-white differentiation. No mass effect or midline shift  is seen.  There is no evidence of fracture; visualized osseous structures are unremarkable in appearance. The orbits are within normal limits. There is opacification of the right mastoid air cells. The paranasal sinuses and left mastoid air cells are well-aerated. No significant soft tissue abnormalities are seen.  CT CERVICAL SPINE FINDINGS  There is no evidence of acute fracture or subluxation. Vertebral bodies demonstrate normal height. There is mild grade 1 anterolisthesis of C4 on C5, and disc space narrowing is noted at C5-C6, with associated anterior and posterior disc osteophyte complexes. Mild facet disease is noted along the cervical spine. Prevertebral soft tissues are within normal limits.  The thyroid  gland is unremarkable in appearance. Mild interstitial prominence is noted at the lung apices. Dense calcification is seen at the carotid bifurcations, with likely moderate bilateral luminal narrowing.  IMPRESSION: 1. No evidence of traumatic intracranial injury or fracture. 2. No evidence of acute fracture or subluxation along the cervical spine. 3. Mild to moderate cortical volume loss and scattered small vessel ischemic microangiopathy. 4. Opacification of the right mastoid air cells. 5. Mild degenerative change along the cervical spine. 6. Mild interstitial prominence at the lung apices. 7. Dense calcification at the carotid bifurcations, with likely moderate bilateral luminal narrowing. Carotid ultrasound would be helpful for further evaluation, when and as deemed clinically appropriate.   Electronically Signed   By: Garald Balding M.D.   On: 08/28/2014 02:51   Dg Foot Complete Right  08/28/2014   CLINICAL DATA:  Status post fall; acute onset of right foot pain. Initial encounter.  EXAM: RIGHT FOOT - 3 VIEWS  COMPARISON:  None.  FINDINGS: There is no evidence of fracture or dislocation. There is chronic flattening of the subtalar joint. There is no evidence of talar subluxation. A prominent posterior calcaneal spur is noted.  Diffuse soft tissue swelling is noted about the distal lower leg.  IMPRESSION: 1. No evidence of fracture or dislocation. 2. Chronic flattening of the subtalar joint noted.   Electronically Signed   By: Garald Balding M.D.   On: 08/28/2014 04:31   Dg Hip Unilat With Pelvis 2-3 Views Left  08/28/2014   CLINICAL DATA:  Status post fall; acute onset of left hip pain. Initial encounter.  EXAM: LEFT HIP - 2+ VIEWS  COMPARISON:  Abdominal radiograph performed 08/12/2008  FINDINGS: There is a displaced subcapital fracture through the left femoral neck, with superior displacement of the distal femur. The left femoral head remains seated at the acetabulum. The right hip joint is grossly  unremarkable.  Mild degenerative change is noted at the pubic symphysis. The sacroiliac joints are unremarkable. Minimal degenerative change is seen along the lower lumbar spine. The visualized bowel gas pattern is grossly unremarkable.  IMPRESSION: Displaced subcapital fracture through the left femoral neck, with superior displacement of the distal femur.   Electronically Signed   By: Garald Balding M.D.   On: 08/28/2014 04:35    Review of Systems  Unable to perform ROS  Blood pressure 172/77, pulse 83, temperature 98.6 F (37 C), temperature source Oral, resp. rate 24, SpO2 90 %. Physical Exam  Constitutional: She appears well-developed.  HENT:  Head: Atraumatic.  Eyes: EOM are normal.  Cardiovascular: Intact distal pulses.   Respiratory: Effort normal.  Musculoskeletal:  LLE shortened an ER.  NVID. TTP L hip  Neurological: She is alert.  Skin: Skin is warm and dry.  Psychiatric: She has a normal mood and affect.    Assessment/Plan: Left displaced femoral  neck fracture Recommend left hip hemiarthroplasty to promote early ambulation, prevent complications of bed rest and promote pain relief. This was discussed with the patient and her sister at the bedside. They agree and would like to go forward with surgery. Dr. Mayer Camel, a joint replacement specialist, will be able to perform her surgery this afternoon around 2:30 or 3. I think this will be best for the patient. She will remain nothing by mouth and will be transferred to [facility] on a hospitalist service.   Nita Sells 08/28/2014, 7:28 AM

## 2014-08-29 DIAGNOSIS — S72002D Fracture of unspecified part of neck of left femur, subsequent encounter for closed fracture with routine healing: Secondary | ICD-10-CM

## 2014-08-29 LAB — BASIC METABOLIC PANEL
ANION GAP: 14 (ref 5–15)
BUN: 22 mg/dL (ref 6–23)
CALCIUM: 8.4 mg/dL (ref 8.4–10.5)
CO2: 26 mmol/L (ref 19–32)
CREATININE: 0.81 mg/dL (ref 0.50–1.10)
Chloride: 98 mEq/L (ref 96–112)
GFR calc non Af Amer: 63 mL/min — ABNORMAL LOW (ref >90)
GFR, EST AFRICAN AMERICAN: 72 mL/min — AB (ref >90)
Glucose, Bld: 134 mg/dL — ABNORMAL HIGH (ref 70–99)
Potassium: 3.8 mmol/L (ref 3.5–5.1)
Sodium: 138 mmol/L (ref 135–145)

## 2014-08-29 LAB — CBC
HCT: 32 % — ABNORMAL LOW (ref 36.0–46.0)
HEMOGLOBIN: 10.6 g/dL — AB (ref 12.0–15.0)
MCH: 29.4 pg (ref 26.0–34.0)
MCHC: 33.1 g/dL (ref 30.0–36.0)
MCV: 88.9 fL (ref 78.0–100.0)
Platelets: 177 10*3/uL (ref 150–400)
RBC: 3.6 MIL/uL — AB (ref 3.87–5.11)
RDW: 14.3 % (ref 11.5–15.5)
WBC: 9.5 10*3/uL (ref 4.0–10.5)

## 2014-08-29 LAB — MAGNESIUM: Magnesium: 1.5 mg/dL (ref 1.5–2.5)

## 2014-08-29 MED ORDER — CEFTRIAXONE SODIUM IN DEXTROSE 20 MG/ML IV SOLN
1.0000 g | INTRAVENOUS | Status: DC
  Administered 2014-08-29 – 2014-09-01 (×4): 1 g via INTRAVENOUS
  Filled 2014-08-29 (×5): qty 50

## 2014-08-29 NOTE — Progress Notes (Signed)
Patient ID: Brianna Robbins, female   DOB: 01/25/1923, 81 y.o.   MRN: [id number] PATIENT ID: Brianna Robbins  MRN: [id number]  DOB/AGE:  [birth year] / 81 y.o.  1 Day Post-Op Procedure(s) (LRB): LEFT HEMI HIP (Left)    PROGRESS NOTE Subjective: Patient is alert, oriented, no Nausea, no Vomiting, yes passing gas, no Bowel Movement. Taking PO sips. Denies SOB, Chest or Calf Pain. Using Incentive Spirometer, PAS in place. Ambulate WBAT with PT Patient reports pain as 2 on 0-10 scale  .    Objective: Vital signs in last 24 hours: Filed Vitals:   08/28/14 2030 08/28/14 2116 08/29/14 0206 08/29/14 0515  BP: 99/47 115/84 119/64 118/54  Pulse: 72 81 84 86  Temp:  98.4 F (36.9 C) 97.6 F (36.4 C) 99 F (37.2 C)  TempSrc:  Oral Oral Axillary  Resp: 23  17 16   Height:      Weight:      SpO2: 91% 91% 93% 93%      Intake/Output from previous day: I/O last 3 completed shifts: In: 1000 [I.V.:1000] Out: 1250 [Urine:1050; Blood:200]   Intake/Output this shift:     LABORATORY DATA:  Recent Labs  08/28/14 0202 08/28/14 0905  WBC 13.9*  --   HGB 12.8  --   HCT 39.5  --   PLT 221  --   NA 137 137  K 2.8* 3.1*  CL 102 103  CO2 25 26  BUN 25* 21  CREATININE 0.74 0.53  GLUCOSE 173* 149*  CALCIUM 8.5 8.6    Examination: Neurologically intact ABD soft Neurovascular intact Sensation intact distally Intact pulses distally Dorsiflexion/Plantar flexion intact Incision: dressing C/D/I No cellulitis present Compartment soft} XR AP&Lat of hip shows well placed\fixed HHA  Assessment:   1 Day Post-Op Procedure(s) (LRB): LEFT HEMI HIP (Left) ADDITIONAL DIAGNOSIS:  Expected Acute Blood Loss Anemia, Hypertension, dementia  Plan: PT/OT WBAT, THA  posterior precautions  DVT Prophylaxis: SCDx72 hrs, ASA 325 mg BID x 2 weeks  DISCHARGE PLAN: Skilled Nursing Facility/Rehab  DISCHARGE NEEDS: HHPT, HHRN, Walker and 3-in-1 comode seat

## 2014-08-29 NOTE — Progress Notes (Signed)
PROGRESS NOTE  Brianna Robbins:[id number] DOB: 1922-10-18 DOA: 08/28/2014 PCP: No primary care provider on file.  HPI: Brianna Robbins is a 81 y.o. female with Past medical history of arthritis, essential hypertension, dementia, recurrent fall, diverticular bleeding, dementia. The patient is a resident of an assisted living facility and today she comes in with a fall. The fall was not witnessed and the patient is unable to provide any history due to dementia. She was found to have a hip fracture.  Subjective / 24 H Interval events Has no complaints this morning, asking me about food, denies chest pain/dyspnea, denies abdominal pain  Assessment/Plan: Principal Problem:   Left displaced femoral neck fracture Active Problems:   Closed left hip fracture   Essential hypertension   Hypoxia   Hypokalemia   Dementia   HOH (hard of hearing)   Femoral neck fracture   Left displaced femoral neck fracture - s/p repair 1/13, doing well post op - labs pending this morning  Hypertension - Continue amlodipine, Ziac  Hypokalemia - Patient has received replacement potassium. - Recheck potassium.  Dementia - At present not having any behavioral disturbances continue to closely monitor for signs of delirium.  Hypothyroidism - continue synthroid  Depression - continue Prozac  Diet: Diet regular Fluids: none DVT Prophylaxis: Aspirin  Code Status: DNR Family Communication: d/w granddaughter bedside  Disposition Plan: SNF/Rehab when ready, PT evaluation pending  Consultants:  Orthopedic surgery   Procedures:  L hemi hip arthroplasty 1/13   Antibiotics  Anti-infectives    Start     Dose/Rate Route Frequency Ordered Stop   08/28/14 0815  ceFAZolin (ANCEF) IVPB 2 g/50 mL premix     2 g100 mL/hr over 30 Minutes Intravenous On call to O.R. 08/28/14 0813 08/28/14 1627       Studies  Dg Chest 1 View  08/28/2014   CLINICAL DATA:  Status post fall; concern for chest injury.  Initial encounter.  EXAM: CHEST - 1 VIEW  COMPARISON:  Chest radiograph and CT of the thoracic spine performed 10/16/2011  FINDINGS: The lungs are mildly hypoexpanded. Vascular crowding and vascular congestion are seen. Minimal bilateral atelectasis is noted. There is no evidence of pleural effusion or pneumothorax.  The cardiomediastinal silhouette is mildly enlarged. No acute osseous abnormalities are seen. Calcification overlying the lateral aspect of the left humeral head is compatible with calcific tendinitis.  IMPRESSION: 1. Lungs mildly hypoexpanded. Vascular congestion and mild cardiomegaly. Mild bilateral atelectasis noted. 2. No displaced rib fracture seen. 3. Changes of calcific tendinitis overlying the left humeral head.   Electronically Signed   By: Garald Balding M.D.   On: 08/28/2014 04:27   Ct Head Wo Contrast  08/28/2014   CLINICAL DATA:  Status post fall; found on floor. Diffuse headache. Concern for cervical spine injury. Initial encounter.  EXAM: CT HEAD WITHOUT CONTRAST  CT CERVICAL SPINE WITHOUT CONTRAST  TECHNIQUE: Multidetector CT imaging of the head and cervical spine was performed following the standard protocol without intravenous contrast. Multiplanar CT image reconstructions of the cervical spine were also generated.  COMPARISON:  MRI of the brain performed 02/29/2008, and CT of the head performed 02/28/2008  FINDINGS: CT HEAD FINDINGS  There is no evidence of acute infarction, mass lesion, or intra- or extra-axial hemorrhage on CT.  Prominence of the ventricles and sulci reflects mild to moderate cortical volume loss. Mild cerebellar atrophy is noted. Mild periventricular white matter change likely reflects small vessel ischemic microangiopathy.  The brainstem and  fourth ventricle are within normal limits. The basal ganglia are unremarkable in appearance. The cerebral hemispheres demonstrate grossly normal gray-white differentiation. No mass effect or midline shift is seen.  There is  no evidence of fracture; visualized osseous structures are unremarkable in appearance. The orbits are within normal limits. There is opacification of the right mastoid air cells. The paranasal sinuses and left mastoid air cells are well-aerated. No significant soft tissue abnormalities are seen.  CT CERVICAL SPINE FINDINGS  There is no evidence of acute fracture or subluxation. Vertebral bodies demonstrate normal height. There is mild grade 1 anterolisthesis of C4 on C5, and disc space narrowing is noted at C5-C6, with associated anterior and posterior disc osteophyte complexes. Mild facet disease is noted along the cervical spine. Prevertebral soft tissues are within normal limits.  The thyroid gland is unremarkable in appearance. Mild interstitial prominence is noted at the lung apices. Dense calcification is seen at the carotid bifurcations, with likely moderate bilateral luminal narrowing.  IMPRESSION: 1. No evidence of traumatic intracranial injury or fracture. 2. No evidence of acute fracture or subluxation along the cervical spine. 3. Mild to moderate cortical volume loss and scattered small vessel ischemic microangiopathy. 4. Opacification of the right mastoid air cells. 5. Mild degenerative change along the cervical spine. 6. Mild interstitial prominence at the lung apices. 7. Dense calcification at the carotid bifurcations, with likely moderate bilateral luminal narrowing. Carotid ultrasound would be helpful for further evaluation, when and as deemed clinically appropriate.   Electronically Signed   By: Garald Balding M.D.   On: 08/28/2014 02:51   Ct Cervical Spine Wo Contrast  08/28/2014   CLINICAL DATA:  Status post fall; found on floor. Diffuse headache. Concern for cervical spine injury. Initial encounter.  EXAM: CT HEAD WITHOUT CONTRAST  CT CERVICAL SPINE WITHOUT CONTRAST  TECHNIQUE: Multidetector CT imaging of the head and cervical spine was performed following the standard protocol without  intravenous contrast. Multiplanar CT image reconstructions of the cervical spine were also generated.  COMPARISON:  MRI of the brain performed 02/29/2008, and CT of the head performed 02/28/2008  FINDINGS: CT HEAD FINDINGS  There is no evidence of acute infarction, mass lesion, or intra- or extra-axial hemorrhage on CT.  Prominence of the ventricles and sulci reflects mild to moderate cortical volume loss. Mild cerebellar atrophy is noted. Mild periventricular white matter change likely reflects small vessel ischemic microangiopathy.  The brainstem and fourth ventricle are within normal limits. The basal ganglia are unremarkable in appearance. The cerebral hemispheres demonstrate grossly normal gray-white differentiation. No mass effect or midline shift is seen.  There is no evidence of fracture; visualized osseous structures are unremarkable in appearance. The orbits are within normal limits. There is opacification of the right mastoid air cells. The paranasal sinuses and left mastoid air cells are well-aerated. No significant soft tissue abnormalities are seen.  CT CERVICAL SPINE FINDINGS  There is no evidence of acute fracture or subluxation. Vertebral bodies demonstrate normal height. There is mild grade 1 anterolisthesis of C4 on C5, and disc space narrowing is noted at C5-C6, with associated anterior and posterior disc osteophyte complexes. Mild facet disease is noted along the cervical spine. Prevertebral soft tissues are within normal limits.  The thyroid gland is unremarkable in appearance. Mild interstitial prominence is noted at the lung apices. Dense calcification is seen at the carotid bifurcations, with likely moderate bilateral luminal narrowing.  IMPRESSION: 1. No evidence of traumatic intracranial injury or fracture. 2. No evidence of  acute fracture or subluxation along the cervical spine. 3. Mild to moderate cortical volume loss and scattered small vessel ischemic microangiopathy. 4. Opacification  of the right mastoid air cells. 5. Mild degenerative change along the cervical spine. 6. Mild interstitial prominence at the lung apices. 7. Dense calcification at the carotid bifurcations, with likely moderate bilateral luminal narrowing. Carotid ultrasound would be helpful for further evaluation, when and as deemed clinically appropriate.   Electronically Signed   By: Garald Balding M.D.   On: 08/28/2014 02:51   Pelvis Portable  08/28/2014   CLINICAL DATA:  Status post hemiarthroplasty of the left hip  EXAM: PORTABLE PELVIS 1-2 VIEWS  COMPARISON:  None.  FINDINGS: Interval left hemiarthroplasty without failure or complication. There is no fracture or dislocation. Postsurgical changes in the surrounding soft tissues.  IMPRESSION: Interval left hemi arthroplasty.   Electronically Signed   By: Kathreen Devoid   On: 08/28/2014 19:29   Dg Foot Complete Right  08/28/2014   CLINICAL DATA:  Status post fall; acute onset of right foot pain. Initial encounter.  EXAM: RIGHT FOOT - 3 VIEWS  COMPARISON:  None.  FINDINGS: There is no evidence of fracture or dislocation. There is chronic flattening of the subtalar joint. There is no evidence of talar subluxation. A prominent posterior calcaneal spur is noted.  Diffuse soft tissue swelling is noted about the distal lower leg.  IMPRESSION: 1. No evidence of fracture or dislocation. 2. Chronic flattening of the subtalar joint noted.   Electronically Signed   By: Garald Balding M.D.   On: 08/28/2014 04:31   Dg Hip Unilat With Pelvis 2-3 Views Left  08/28/2014   CLINICAL DATA:  Status post fall; acute onset of left hip pain. Initial encounter.  EXAM: LEFT HIP - 2+ VIEWS  COMPARISON:  Abdominal radiograph performed 08/12/2008  FINDINGS: There is a displaced subcapital fracture through the left femoral neck, with superior displacement of the distal femur. The left femoral head remains seated at the acetabulum. The right hip joint is grossly unremarkable.  Mild degenerative change  is noted at the pubic symphysis. The sacroiliac joints are unremarkable. Minimal degenerative change is seen along the lower lumbar spine. The visualized bowel gas pattern is grossly unremarkable.  IMPRESSION: Displaced subcapital fracture through the left femoral neck, with superior displacement of the distal femur.   Electronically Signed   By: Garald Balding M.D.   On: 08/28/2014 04:35    Objective  Filed Vitals:   08/28/14 2030 08/28/14 2116 08/29/14 0206 08/29/14 0515  BP: 99/47 115/84 119/64 118/54  Pulse: 72 81 84 86  Temp:  98.4 F (36.9 C) 97.6 F (36.4 C) 99 F (37.2 C)  TempSrc:  Oral Oral Axillary  Resp: 23  17 16   Height:      Weight:      SpO2: 91% 91% 93% 93%    Intake/Output Summary (Last 24 hours) at 08/29/14 0708 Last data filed at 08/29/14 0515  Gross per 24 hour  Intake   1000 ml  Output   1250 ml  Net   -250 ml   Filed Weights   08/28/14 0800  Weight: 75.3 kg (166 lb 0.1 oz)    Exam:  General:  NAD, pleasant, confused  HEENT: no scleral icterus  Cardiovascular: RRR without murmurs  Respiratory: CTA biL  Abdomen: soft, non tender  MSK/Extremities: no clubbing/cyanosis, left hip dressing intact  Skin: no rashes  Neuro: non focal  Data Reviewed: Basic Metabolic Panel:  Recent  Labs Lab 08/28/14 0202 08/28/14 0905  NA 137 137  K 2.8* 3.1*  CL 102 103  CO2 25 26  GLUCOSE 173* 149*  BUN 25* 21  CREATININE 0.74 0.53  CALCIUM 8.5 8.6  MG  --  1.6   Liver Function Tests:  Recent Labs Lab 08/28/14 0202  AST 34  ALT 26  ALKPHOS 98  BILITOT 0.6  PROT 6.9  ALBUMIN 3.9   CBC:  Recent Labs Lab 08/28/14 0202  WBC 13.9*  NEUTROABS 12.0*  HGB 12.8  HCT 39.5  MCV 88.6  PLT 221    Recent Results (from the past 240 hour(s))  Surgical pcr screen     Status: Abnormal   Collection Time: 08/28/14 12:40 PM  Result Value Ref Range Status   MRSA, PCR NEGATIVE NEGATIVE Final   Staphylococcus aureus POSITIVE (A) NEGATIVE Final     Comment:        The Xpert SA Assay (FDA approved for NASAL specimens in patients over 53 years of age), is one component of a comprehensive surveillance program.  Test performance has been validated by [employer] for patients greater than or equal to 65 year old. It is not intended to diagnose infection nor to guide or monitor treatment.      Scheduled Meds: . amLODipine  5 mg Oral Daily  . aspirin EC  325 mg Oral BID WC  . bisoprolol-hydrochlorothiazide  1 tablet Oral Daily  . darifenacin  7.5 mg Oral Daily  . docusate sodium  100 mg Oral BID  . fentaNYL      . FLUoxetine  40 mg Oral Daily  . heparin  5,000 Units Subcutaneous 3 times per day  . levothyroxine  112 mcg Oral QAC breakfast  . magnesium sulfate 1 - 4 g bolus IVPB  3 g Intravenous Once  . mirtazapine  7.5 mg Oral QHS  . pantoprazole  40 mg Oral Daily   Continuous Infusions: . dextrose 5 % and 0.45 % NaCl with KCl 20 mEq/L 100 mL/hr at 08/29/14 9242    Marzetta Board, MD [facility] Pager [phone]. If 7 PM - 7 AM, please contact night-coverage at [email or web address], password [facility] 08/29/2014, 7:08 AM  LOS: 1 day

## 2014-08-29 NOTE — Evaluation (Signed)
Physical Therapy Evaluation Patient Details Name: Brianna Robbins MRN: [id number] DOB: 04/13/1923 Today's Date: 08/29/2014   History of Present Illness  Pt is a 81 y.o female with dementia who suffered a fall at ALF. pt  is s/p Lt THA of femoral neck fx.   Clinical Impression  Pt is s/p Lt posterior THA due to fall at ALF,   resulting in the deficits listed below (see PT Problem List). Pt will benefit from skilled PT to increase their independence and safety with mobility to allow discharge to the venue listed below. Pt with baseline dementia. Will require SNF for post acute rehab and 24/7 (A) upon D/C home. Pt requiring 2 person (A) at this time for mobility.      Follow Up Recommendations SNF;Supervision/Assistance - 24 hour    Equipment Recommendations  Other (comment) (TBD)    Recommendations for Other Services OT consult     Precautions / Restrictions Precautions Precautions: Posterior Hip;Fall Precaution Comments: reviewed with family member  Restrictions Weight Bearing Restrictions: Yes LLE Weight Bearing: Weight bearing as tolerated      Mobility  Bed Mobility Overal bed mobility: Needs Assistance;+2 for physical assistance Bed Mobility: Supine to Sit     Supine to sit: +2 for physical assistance;Mod assist;HOB elevated     General bed mobility comments: pt with difficulty advancing LEs to EOB and following verbal commands; requires multimodal cues and hand over hand (A) for sequencing; use of draw pad to bring hips to EOB and to sitting position  Transfers Overall transfer level: Needs assistance Equipment used: 2 person hand held assist Transfers: Stand Pivot Transfers;Sit to/from Stand Sit to Stand: +2 physical assistance;Mod assist Stand pivot transfers: +2 physical assistance;Max assist       General transfer comment: performed sit to stand x 2; 2 person (A) with use of draw pad to bring to standing; max multimodal cues for sequencing; pt was able to WB  through [place] but had difficulty pivoting to chair   Ambulation/Gait             General Gait Details: pivotal steps only   Stairs            Wheelchair Mobility    Modified Rankin (Stroke Patients Only)       Balance Overall balance assessment: Needs assistance;History of Falls Sitting-balance support: Feet supported;Single extremity supported Sitting balance-Leahy Scale: Poor Sitting balance - Comments: UE support and leaning to off weight Lt hip Postural control: Right lateral lean Standing balance support: During functional activity;Bilateral upper extremity supported Standing balance-Leahy Scale: Zero Standing balance comment: 2  person (A) to balance                              Pertinent Vitals/Pain Pain Assessment: No/denies pain    Home Living Family/patient expects to be discharged to:: Skilled nursing facility                 Additional Comments: From [facility], will need SNF     Prior Function Level of Independence: Needs assistance   Gait / Transfers Assistance Needed: ambulated with RW  ADL's / Homemaking Assistance Needed: (A) as needed   Comments: ALF handles medications     Hand Dominance        Extremity/Trunk Assessment   Upper Extremity Assessment: Defer to OT evaluation           Lower Extremity Assessment: Difficult to assess due  to impaired cognition;LLE deficits/detail   LLE Deficits / Details: was able to perform LAQ in sitting   Cervical / Trunk Assessment: Normal  Communication   Communication: HOH  Cognition Arousal/Alertness: Awake/alert Behavior During Therapy: Agitated;Anxious Overall Cognitive Status: History of cognitive impairments - at baseline       Memory: Decreased short-term memory;Decreased recall of precautions              General Comments      Exercises Low Level/ICU Exercises Ankle Circles/Pumps: PROM;Both;10 reps;Supine      Assessment/Plan    PT  Assessment Patient needs continued PT services  PT Diagnosis Difficulty walking;Generalized weakness;Acute pain   PT Problem List Decreased strength;Decreased range of motion;Decreased activity tolerance;Decreased balance;Decreased mobility;Decreased cognition;Decreased safety awareness;Decreased knowledge of use of DME;Pain;Decreased knowledge of precautions  PT Treatment Interventions DME instruction;Gait training;Functional mobility training;Therapeutic activities;Balance training;Therapeutic exercise;Neuromuscular re-education;Patient/family education   PT Goals (Current goals can be found in the Care Plan section) Acute Rehab PT Goals Patient Stated Goal: niece stated "the goal is for her to eventually return to her ALF"  PT Goal Formulation: With patient/family Time For Goal Achievement: 09/05/14 Potential to Achieve Goals: Fair    Frequency Min 3X/week   Barriers to discharge Decreased caregiver support from ALF, will need SNF    Co-evaluation               End of Session Equipment Utilized During Treatment: Gait belt;Oxygen Activity Tolerance: Patient tolerated treatment well Patient left: in chair;with call bell/phone within reach;with chair alarm set;with family/visitor present Nurse Communication: Mobility status;Precautions;Weight bearing status         Time: 8270-7867 PT Time Calculation (min) (ACUTE ONLY): 19 min   Charges:   PT Evaluation $Initial PT Evaluation Tier I: 1 Procedure PT Treatments $Therapeutic Activity: 8-22 mins   PT G CodesGustavus Bryant, [place]  [phone] 08/29/2014, 11:13 AM

## 2014-08-29 NOTE — Progress Notes (Signed)
Utilization review completed.  

## 2014-08-29 NOTE — Progress Notes (Signed)
Orthopedic Tech Progress Note Patient Details:  [name] 20-May-1923 [id number]  Patient ID: Brianna Robbins, female   DOB: May 09, 1923, 81 y.o.   MRN: [id number] Pt unable to use trapeze bar patient helper  Hildred Priest 08/29/2014, 1:17 PM

## 2014-08-30 ENCOUNTER — Inpatient Hospital Stay (HOSPITAL_COMMUNITY): Payer: Medicare Other

## 2014-08-30 LAB — URINE CULTURE

## 2014-08-30 LAB — BASIC METABOLIC PANEL
ANION GAP: 8 (ref 5–15)
BUN: 18 mg/dL (ref 6–23)
CALCIUM: 7.6 mg/dL — AB (ref 8.4–10.5)
CHLORIDE: 98 meq/L (ref 96–112)
CO2: 24 mmol/L (ref 19–32)
Creatinine, Ser: 0.67 mg/dL (ref 0.50–1.10)
GFR calc Af Amer: 88 mL/min — ABNORMAL LOW (ref >90)
GFR calc non Af Amer: 76 mL/min — ABNORMAL LOW (ref >90)
GLUCOSE: 150 mg/dL — AB (ref 70–99)
Potassium: 3.5 mmol/L (ref 3.5–5.1)
Sodium: 130 mmol/L — ABNORMAL LOW (ref 135–145)

## 2014-08-30 LAB — CBC
HCT: 28.7 % — ABNORMAL LOW (ref 36.0–46.0)
Hemoglobin: 9.6 g/dL — ABNORMAL LOW (ref 12.0–15.0)
MCH: 29.3 pg (ref 26.0–34.0)
MCHC: 33.4 g/dL (ref 30.0–36.0)
MCV: 87.5 fL (ref 78.0–100.0)
Platelets: 136 10*3/uL — ABNORMAL LOW (ref 150–400)
RBC: 3.28 MIL/uL — AB (ref 3.87–5.11)
RDW: 14.3 % (ref 11.5–15.5)
WBC: 8.3 10*3/uL (ref 4.0–10.5)

## 2014-08-30 LAB — D-DIMER, QUANTITATIVE (NOT AT ARMC): D DIMER QUANT: 2.71 ug{FEU}/mL — AB (ref 0.00–0.48)

## 2014-08-30 LAB — BRAIN NATRIURETIC PEPTIDE: B Natriuretic Peptide: 40.5 pg/mL (ref 0.0–100.0)

## 2014-08-30 LAB — TROPONIN I: Troponin I: <0.03 ng/mL (ref <0.031)

## 2014-08-30 LAB — LACTIC ACID, PLASMA: LACTIC ACID, VENOUS: 1.5 mmol/L (ref 0.5–2.2)

## 2014-08-30 MED ORDER — LORAZEPAM 2 MG/ML IJ SOLN
0.5000 mg | Freq: Four times a day (QID) | INTRAMUSCULAR | Status: DC | PRN
  Administered 2014-08-31 – 2014-09-01 (×2): 0.5 mg via INTRAVENOUS
  Filled 2014-08-30 (×2): qty 1

## 2014-08-30 MED ORDER — DEXTROSE-NACL 5-0.9 % IV SOLN: INTRAVENOUS | Status: DC

## 2014-08-30 MED ORDER — LORAZEPAM 2 MG/ML IJ SOLN
0.2500 mg | Freq: Once | INTRAMUSCULAR | Status: AC
  Administered 2014-08-30: 0.25 mg via INTRAVENOUS
  Filled 2014-08-30 (×2): qty 1

## 2014-08-30 MED ORDER — FUROSEMIDE 10 MG/ML IJ SOLN: 20.0000 mg | Freq: Once | INTRAMUSCULAR | Status: DC

## 2014-08-30 MED ORDER — HALOPERIDOL LACTATE 5 MG/ML IJ SOLN: 1.0000 mg | Freq: Once | INTRAMUSCULAR | Status: DC

## 2014-08-30 MED ORDER — FUROSEMIDE 10 MG/ML IJ SOLN
20.0000 mg | Freq: Once | INTRAMUSCULAR | Status: AC
  Administered 2014-08-30: 20 mg via INTRAVENOUS
  Filled 2014-08-30: qty 2

## 2014-08-30 MED ORDER — FUROSEMIDE 10 MG/ML IJ SOLN
40.0000 mg | Freq: Once | INTRAMUSCULAR | Status: AC
  Administered 2014-08-30: 40 mg via INTRAVENOUS
  Filled 2014-08-30: qty 4

## 2014-08-30 NOTE — Progress Notes (Addendum)
Shift event: RN paged floor coverage for pt's temp 102 and desaturations into the 80s with increased WOB. Blood cultures, lactate, and pro BNP ordered.   Jyl Heinz, PA called this NP to see pt given this NP is on location.  S: Denies chest pain (? Reliable given hx dementia) Sister says no hx of irreg heart rhythm or CHF.  O: Frail elderly WF in mild respiratory distress. Lethargic and arouses to tactile stimulation for only seconds. VS reviewed. Card: irreg. Resp: Lungs with diminished breath sounds at the bases. Do not hear any crackles, rales or rhonchi. No wheezing. RR mid 20s with use of accessory muscles. Ankles with 1+ edema. A/P: 1. Hypoxia-given temp, was suspicious for PNA, but CXR is neg. Consideration for pulmonary edema as well, but none on CXR. Diff would include PE given her recent surgery. Check DDimer. Pt placed on NRB and respirations have slowed some. Do not want to get an ABG at this time because sister [facility]) is not sure how aggressive she wants to get with treatment. If DDimer is positive, speak to sister first, as she may not want CTA/VQ scan or anticoagulation treatment. Pt would not be a candidate for long term coagulation given dementia and fall hx. She has only had one Vicodin this shift so do not think she is overly sedated. Hasn't had any benzos.  2. UTI-klebsiella on culture. On Rocephin. Likely reason for temp and she may have SIRs or sepsis brewing.  3. Irreg rhythm on auscultation-EKG shows sinus arrhythmia and PVCs. No Afib. No tachycardia. BP stable.  CBC, BMP, blood cultures, pro BNP, Lactate and DDimer pending.  Before proceeding with further aggressive measures, please speak to pt's sister.  Will report to oncoming attending. Clance Boll, NP [facility] Report given to Dr. Cruzita Lederer, attending. Discussed treatment plan with sister now that elevated DDimer is back. Sister wants to wait awhile before deciding about CTA. Also, discussed possible BIPAP with  sister and she DOES NOT want to progress beyond a NRB.  KJKG, NP

## 2014-08-30 NOTE — Progress Notes (Signed)
Chaplain followed up with pt and family after referral from nurse and chaplain. Chaplain met pt family at bedside. More family on the way. Pt is described as a "faithful woman" and was praying this morning. Upon chaplain arrival pt sleeping. Chaplain offered prayer and emotional support. Chaplain will continue to follow.   08/30/14 1100  Clinical Encounter Type  Visited With Patient and family together  Visit Type Initial;Spiritual support  Referral From Nurse;Chaplain  Spiritual Encounters  Spiritual Needs Emotional;Prayer  Stress Factors  Family Stress Factors Loss  Rolly Salter, Chaplain 08/30/2014 11:54 AM

## 2014-08-30 NOTE — Clinical Social Work Note (Signed)
CSW continuing to follow and available for assistance as needed.  Nonnie Done, [place] [phone]  Psychiatric & Orthopedics (5N 1-16) Clinical Social Worker

## 2014-08-30 NOTE — Progress Notes (Signed)
PROGRESS NOTE  Brianna Robbins ZOX:[id number] DOB: [birth year] DOA: 08/28/2014 PCP: No primary care provider on file.  HPI: Brianna Robbins is a 81 y.o. female with Past medical history of arthritis, essential hypertension, dementia, recurrent fall, diverticular bleeding, dementia. The patient is a resident of an assisted living facility and today she comes in with a fall. The fall was not witnessed and the patient is unable to provide any history due to dementia. She was found to have a hip fracture.  Subjective / 24 H Interval events Overnight patient with increasing oxygen requirements, febrile, this morning on a NRB  Assessment/Plan: Principal Problem:   Left displaced femoral neck fracture Active Problems:   Closed left hip fracture   Essential hypertension   Hypoxia   Hypokalemia   Dementia   HOH (hard of hearing)   Femoral neck fracture  Acute respiratory failure with hypoxia - CXR overnight without evidence of infiltrate, positive for cardiomegaly - Lasix to help with positive balance, however no UOP recorded, she does not look fluid overloaded, CXR without edema, no crackles on exam - proBNP normal - obtain troponin - EKG with non specific T wave flattening - D dimer elevated but doubt acute PE within 24 hours from surgery   Goals of care - Discussed with her sister extensively this morning, per family wishes they do not want very aggressive interventions, she has advanced dementia and a poor quality of life prior to this happening. She confirmed DNR/DNI status and would not want patient moved to ICU nor CTAngio and wishes to focus on comfort and to try for now with medical management with antibiotics and Lasix.   UTI  - patient with history of recurrent UTIs followed by outpatient urology - urine cultures with Klebsiella sensitive to Ceftriaxone, started 1/14  Left displaced femoral neck fracture - s/p repair 1/13  Hypertension - Continue amlodipine, Ziac  Hypokalemia -  Patient has received replacement potassium. - Lasix this morning, monitor BMP  Dementia - profound  Hypothyroidism - continue synthroid  Depression - continue Prozac  Diet: Diet regular Fluids: none DVT Prophylaxis: Aspirin  Code Status: DNR Family Communication: d/w sister bedside  Disposition Plan: remain inpatient  Consultants:  Orthopedic surgery   Procedures:  L hemi hip arthroplasty 1/13   Antibiotics Ceftriaxone 1/14 >>   Studies  Pelvis Portable  08/28/2014   CLINICAL DATA:  Status post hemiarthroplasty of the left hip  EXAM: PORTABLE PELVIS 1-2 VIEWS  COMPARISON:  None.  FINDINGS: Interval left hemiarthroplasty without failure or complication. There is no fracture or dislocation. Postsurgical changes in the surrounding soft tissues.  IMPRESSION: Interval left hemi arthroplasty.   Electronically Signed   By: Kathreen Devoid   On: 08/28/2014 19:29   Dg Chest Port 1 View  08/30/2014   CLINICAL DATA:  Acute onset of hypoxia.  Initial encounter.  EXAM: PORTABLE CHEST - 1 VIEW  COMPARISON:  Chest radiograph performed 08/28/2014  FINDINGS: There is elevation of the right hemidiaphragm. Mild bibasilar atelectasis is seen. No pleural effusion or pneumothorax is identified.  The cardiomediastinal silhouette is enlarged. A moderate to large hiatal hernia is again seen. No acute osseous abnormalities are seen.  IMPRESSION: 1. Elevation of the right hemidiaphragm. Mild bibasilar atelectasis noted. 2. Cardiomegaly. 3. Moderate to large hiatal hernia noted.   Electronically Signed   By: Garald Balding M.D.   On: 08/30/2014 05:52    Objective  Filed Vitals:   08/29/14 2256 08/29/14 2356 08/30/14 0413 08/30/14  0510  BP: 136/56  130/81   Pulse: 80  113   Temp: 98.1 F (36.7 C)  100.9 F (38.3 C) 102 F (38.9 C)  TempSrc: Oral  Axillary Rectal  Resp: 17 17 24    Height:      Weight:      SpO2: 91% 91% 90% 94%    Intake/Output Summary (Last 24 hours) at 08/30/14 0017 Last  data filed at 08/29/14 1700  Gross per 24 hour  Intake   2710 ml  Output      0 ml  Net   2710 ml   Filed Weights   08/28/14 0800  Weight: 75.3 kg (166 lb 0.1 oz)    Exam:  General:  On NRB, increased WOB  HEENT: no scleral icterus  Cardiovascular: RRR without murmurs  Respiratory: no wheezing, clear on anterior auscultaion  Abdomen: soft  MSK/Extremities: no clubbing/cyanosis  Skin: no rashes  Neuro: opens eyes but not following commands  Data Reviewed: Basic Metabolic Panel:  Recent Labs Lab 08/28/14 0202 08/28/14 0905 08/29/14 0400 08/30/14 0505  NA 137 137 138 130*  K 2.8* 3.1* 3.8 3.5  CL 102 103 98 98  CO2 25 26 26 24   GLUCOSE 173* 149* 134* 150*  BUN 25* 21 22 18   CREATININE 0.74 0.53 0.81 0.67  CALCIUM 8.5 8.6 8.4 7.6*  MG  --  1.6 1.5  --    Liver Function Tests:  Recent Labs Lab 08/28/14 0202  AST 34  ALT 26  ALKPHOS 98  BILITOT 0.6  PROT 6.9  ALBUMIN 3.9   CBC:  Recent Labs Lab 08/28/14 0202 08/29/14 0830 08/30/14 0505  WBC 13.9* 9.5 8.3  NEUTROABS 12.0*  --   --   HGB 12.8 10.6* 9.6*  HCT 39.5 32.0* 28.7*  MCV 88.6 88.9 87.5  PLT 221 177 136*    Recent Results (from the past 240 hour(s))  Culture, Urine     Status: None   Collection Time: 08/28/14  9:52 AM  Result Value Ref Range Status   Specimen Description URINE, CATHETERIZED  Final   Special Requests NONE  Final   Colony Count   Final    >=100,000 COLONIES/ML Performed at [facility]    Culture   Final    KLEBSIELLA PNEUMONIAE Performed at [facility]    Report Status 08/30/2014 FINAL  Final   Organism ID, Bacteria KLEBSIELLA PNEUMONIAE  Final      Susceptibility   Klebsiella pneumoniae - MIC*    AMPICILLIN >=32 RESISTANT Resistant     CEFAZOLIN 16 INTERMEDIATE Intermediate     CEFTRIAXONE <=1 SENSITIVE Sensitive     CIPROFLOXACIN 1 SENSITIVE Sensitive     GENTAMICIN <=1 SENSITIVE Sensitive     LEVOFLOXACIN 1 SENSITIVE Sensitive      NITROFURANTOIN 128 RESISTANT Resistant     TOBRAMYCIN <=1 SENSITIVE Sensitive     TRIMETH/SULFA <=20 SENSITIVE Sensitive     PIP/TAZO 16 SENSITIVE Sensitive     * KLEBSIELLA PNEUMONIAE  Surgical pcr screen     Status: Abnormal   Collection Time: 08/28/14 12:40 PM  Result Value Ref Range Status   MRSA, PCR NEGATIVE NEGATIVE Final   Staphylococcus aureus POSITIVE (A) NEGATIVE Final    Comment:        The Xpert SA Assay (FDA approved for NASAL specimens in patients over 55 years of age), is one component of a comprehensive surveillance program.  Test performance has been validated by [employer] for  patients greater than or equal to 68 year old. It is not intended to diagnose infection nor to guide or monitor treatment.      Scheduled Meds: . amLODipine  5 mg Oral Daily  . aspirin EC  325 mg Oral BID WC  . bisoprolol-hydrochlorothiazide  1 tablet Oral Daily  . cefTRIAXone (ROCEPHIN)  IV  1 g Intravenous Q24H  . darifenacin  7.5 mg Oral Daily  . docusate sodium  100 mg Oral BID  . FLUoxetine  40 mg Oral Daily  . furosemide  40 mg Intravenous Once  . heparin  5,000 Units Subcutaneous 3 times per day  . levothyroxine  112 mcg Oral QAC breakfast  . magnesium sulfate 1 - 4 g bolus IVPB  3 g Intravenous Once  . mirtazapine  7.5 mg Oral QHS  . pantoprazole  40 mg Oral Daily   Continuous Infusions: . dextrose 5 % and 0.9% NaCl      Marzetta Board, MD [facility] Pager [phone]. If 7 PM - 7 AM, please contact night-coverage at [email or web address], password [facility] 08/30/2014, 7:22 AM  LOS: 2 days

## 2014-08-30 NOTE — Progress Notes (Signed)
PATIENT ID: Brianna Robbins  MRN: [id number]  DOB/AGE:  01-16-1923 / 81 y.o.  2 Days Post-Op Procedure(s) (LRB): LEFT HEMI HIP (Left)    PROGRESS NOTE Subjective: Patient is arousable, but does not answer questions or follow cues.  She is on a non rebreather for oxygen.  Ambulate WBAT Patient has minimal pain with movement of the left leg/hip.  Objective: Vital signs in last 24 hours: Filed Vitals:   08/29/14 2356 08/30/14 0413 08/30/14 0510 08/30/14 0800  BP:  130/81    Pulse:  113    Temp:  100.9 F (38.3 C) 102 F (38.9 C)   TempSrc:  Axillary Rectal   Resp: 17 24  18   Height:      Weight:      SpO2: 91% 90% 94% 100%      Intake/Output from previous day: I/O last 3 completed shifts: In: 2830 [P.O.:2830] Out: 100 [Urine:100]   Intake/Output this shift:     LABORATORY DATA:  Recent Labs  08/29/14 0400 08/29/14 0830 08/30/14 0505  WBC  --  9.5 8.3  HGB  --  10.6* 9.6*  HCT  --  32.0* 28.7*  PLT  --  177 136*  NA 138  --  130*  K 3.8  --  3.5  CL 98  --  98  CO2 26  --  24  BUN 22  --  18  CREATININE 0.81  --  0.67  GLUCOSE 134*  --  150*  CALCIUM 8.4  --  7.6*    Examination: Intact pulses distally Incision: dressing C/D/I No cellulitis present Compartment soft} XR AP&Lat of hip shows well placed\fixed THA  Assessment:   2 Days Post-Op Procedure(s) (LRB): LEFT HEMI HIP (Left) ADDITIONAL DIAGNOSIS:  Expected Acute Blood Loss Anemia, Hypertension, dementia,hypokalemia, Acute resp failure with hypoxia, uti, hypothyroid, depression.  Plan: PT/OT WBAT, THA  posterior precautions  DVT Prophylaxis: SCDx72 hrs, ASA 325 mg BID x 2 weeks  DISCHARGE PLAN: Skilled Nursing Facility/Rehab  DISCHARGE NEEDS: HHPT, HHRN, Walker and 3-in-1 comode seat

## 2014-08-30 NOTE — Progress Notes (Signed)
OT Cancellation Note  Patient Details Name: Brianna Robbins MRN: [id number] DOB: [birth year]   Cancelled Treatment:    Reason Eval/Treat Not Completed: Medical issues which prohibited therapy. Pt with change of status overnight and currently on NRB. Discussed POC with family and at this time they requested that OT hold therapy. OT will sign off at this time. Please re-consult of change in status.   Villa Herb M   Cyndie Chime, OTR/L Occupational Therapist [phone] (pager)  08/30/2014, 9:46 AM

## 2014-08-31 ENCOUNTER — Encounter (HOSPITAL_COMMUNITY): Payer: Self-pay | Admitting: Radiology

## 2014-08-31 ENCOUNTER — Inpatient Hospital Stay (HOSPITAL_COMMUNITY): Payer: Medicare Other

## 2014-08-31 DIAGNOSIS — N3000 Acute cystitis without hematuria: Secondary | ICD-10-CM

## 2014-08-31 DIAGNOSIS — I2699 Other pulmonary embolism without acute cor pulmonale: Secondary | ICD-10-CM

## 2014-08-31 LAB — CBC
HCT: 30 % — ABNORMAL LOW (ref 36.0–46.0)
HEMOGLOBIN: 10 g/dL — AB (ref 12.0–15.0)
MCH: 29.2 pg (ref 26.0–34.0)
MCHC: 33.3 g/dL (ref 30.0–36.0)
MCV: 87.5 fL (ref 78.0–100.0)
PLATELETS: 199 10*3/uL (ref 150–400)
RBC: 3.43 MIL/uL — ABNORMAL LOW (ref 3.87–5.11)
RDW: 14.4 % (ref 11.5–15.5)
WBC: 7.8 10*3/uL (ref 4.0–10.5)

## 2014-08-31 LAB — BASIC METABOLIC PANEL
ANION GAP: 12 (ref 5–15)
BUN: 23 mg/dL (ref 6–23)
CHLORIDE: 100 meq/L (ref 96–112)
CO2: 26 mmol/L (ref 19–32)
CREATININE: 0.7 mg/dL (ref 0.50–1.10)
Calcium: 8.1 mg/dL — ABNORMAL LOW (ref 8.4–10.5)
GFR calc Af Amer: 86 mL/min — ABNORMAL LOW (ref >90)
GFR, EST NON AFRICAN AMERICAN: 75 mL/min — AB (ref >90)
GLUCOSE: 107 mg/dL — AB (ref 70–99)
Potassium: 3.7 mmol/L (ref 3.5–5.1)
SODIUM: 138 mmol/L (ref 135–145)

## 2014-08-31 LAB — HEPARIN LEVEL (UNFRACTIONATED): HEPARIN UNFRACTIONATED: 0.64 [IU]/mL (ref 0.30–0.70)

## 2014-08-31 MED ORDER — HEPARIN BOLUS VIA INFUSION
3000.0000 [IU] | Freq: Once | INTRAVENOUS | Status: AC
  Administered 2014-08-31: 3000 [IU] via INTRAVENOUS
  Filled 2014-08-31: qty 3000

## 2014-08-31 MED ORDER — IOHEXOL 350 MG/ML SOLN
80.0000 mL | Freq: Once | INTRAVENOUS | Status: AC | PRN
  Administered 2014-08-31: 80 mL via INTRAVENOUS

## 2014-08-31 MED ORDER — HEPARIN (PORCINE) IN NACL 100-0.45 UNIT/ML-% IJ SOLN
1000.0000 [IU]/h | INTRAMUSCULAR | Status: DC
  Administered 2014-08-31 – 2014-09-01 (×3): 1000 [IU]/h via INTRAVENOUS
  Filled 2014-08-31 (×2): qty 250

## 2014-08-31 NOTE — Progress Notes (Signed)
ANTICOAGULATION CONSULT NOTE - Initial Consult  Pharmacy Consult for heparin Indication: pulmonary embolus  Allergies  Allergen Reactions  . Sulfa Antibiotics Rash    Patient Measurements: Height: 5' 2.5" (158.8 cm) Weight: 166 lb 0.1 oz (75.3 kg) IBW/kg (Calculated) : 51.25 Heparin Dosing Weight: 67 kg  Vital Signs: Temp: 97.6 F (36.4 C) (01/16 0539) Temp Source: Oral (01/16 0539) BP: 147/99 mmHg (01/16 0539) Pulse Rate: 85 (01/16 0539)  Labs:  Recent Labs  08/29/14 0400  08/29/14 0830 08/30/14 0505 08/30/14 0822 08/31/14 0435  HGB  --   < > 10.6* 9.6*  --  10.0*  HCT  --   --  32.0* 28.7*  --  30.0*  PLT  --   --  177 136*  --  199  CREATININE 0.81  --   --  0.67  --  0.70  TROPONINI  --   --   --   --  <0.03  --   < > = values in this interval not displayed.  Estimated Creatinine Clearance: 45.8 mL/min (by C-G formula based on Cr of 0.7).   Medical History: Past Medical History  Diagnosis Date  . Arthritis   . Hypertension   . Thyroid disease   . Stroke   . Diverticulosis   . HLD (hyperlipidemia)   . Anxiety   . Depression   . GERD (gastroesophageal reflux disease)   . OP (osteoporosis)   . Hypokalemia     Assessment: Patient is an 81 y.o F s/p fall with left displaced femoral neck fracture with repair done on 1/13. Chest CT now back with "small bilateral pulmonary embolism."  Of note, patient received heparin SQ for VTE prophylaxis at 0600 this morning. - spoke to [facility], ok to give heparin bolus for patient  Goal of Therapy:  Heparin level 0.3-0.7 units/ml Monitor platelets by anticoagulation protocol: Yes   Plan:  - heparin 3000 units IV bolus, then drip at 1000 units/hr - check 8 hour heparin level  Jaylon Boylen P 08/31/2014,1:54 PM

## 2014-08-31 NOTE — Progress Notes (Signed)
Notified Dr Cruzita Lederer re; pt CT results.  MD stated he would call ortho then orders to follow.

## 2014-08-31 NOTE — Progress Notes (Signed)
ANTICOAGULATION CONSULT NOTE - Follow Up Consult  Pharmacy Consult for heparin Indication: pulmonary embolus  Labs:  Recent Labs  08/29/14 0400  08/29/14 0830 08/30/14 0505 08/30/14 0822 08/31/14 0435 08/31/14 2210  HGB  --   < > 10.6* 9.6*  --  10.0*  --   HCT  --   --  32.0* 28.7*  --  30.0*  --   PLT  --   --  177 136*  --  199  --   HEPARINUNFRC  --   --   --   --   --   --  0.64  CREATININE 0.81  --   --  0.67  --  0.70  --   TROPONINI  --   --   --   --  <0.03  --   --   < > = values in this interval not displayed.   Assessment/Plan:  81yo female therapeutic on heparin with initial dosing for PE. Will continue gtt at current rate and confirm stable with am labs.   Wynona Neat, PharmD, BCPS  08/31/2014,10:57 PM

## 2014-08-31 NOTE — Progress Notes (Signed)
PATIENT ID: Brianna Robbins MRN: [id number] DOB/AGE: Oct 16, 1922 / 81y.o. 3 Days Post-Op LEFT HEMI HIP per Dr Mayer Camel  PROGRESS NOTE Subjective: Patient is arousable, answers some questions appropriately but still obvious dementia. Ambulating WBAT however her sister states she did not get up out of bed yesterday.Patient has minimal pain with movement of the left leg/hip. Medicine team following for UTI, ARF. Sister reports minimal PO intake, pt has been holding her food and not swallowing, states she is not hungry.   Objective: BP 147/99 mmHg  Pulse 85  Temp(Src) 97.6 F (36.4 C) (Oral)  Resp 20  Ht 5' 2.5" (1.588 m)  Wt 75.3 kg (166 lb 0.1 oz)  BMI 29.86 kg/m2  SpO2 97%  Intake/Output Summary (Last 24 hours) at 08/31/14 1039 Last data filed at 08/31/14 0257  Gross per 24 hour  Intake      0 ml  Output      0 ml  Net      0 ml    LABORATORY DATA: CBC Latest Ref Rng 08/31/2014 08/30/2014 08/29/2014  WBC 4.0 - 10.5 K/uL 7.8 8.3 9.5  Hemoglobin 12.0 - 15.0 g/dL 10.0(L) 9.6(L) 10.6(L)  Hematocrit 36.0 - 46.0 % 30.0(L) 28.7(L) 32.0(L)  Platelets 150 - 400 K/uL 199 136(L) 177    Examination: Intact pulses distally Incision: dressing C/D/I No cellulitis present Compartment soft XR AP&Lat of hip shows well placed\fixed THA  Assessment:  3 Days Post-Op LEFT HEMI HIP  ADDITIONAL DIAGNOSIS: Expected Acute Blood Loss Anemia, Hypertension, dementia,hypokalemia, Acute resp failure with hypoxia, uti, hypothyroid, depression. Followed by medicine team  Plan: PT/OT WBAT, THA posterior precautions, encouraged ambulation with pt  DVT Prophylaxis: SCDx72 hrs, ASA 325 mg BID x 2 weeks  DISCHARGE PLAN: Skilled Nursing Facility/Rehab  DISCHARGE NEEDS: HHPT, HHRN, Walker and 3-in-1 comode seat  Discussed utilization of Boost shakes if not eating

## 2014-08-31 NOTE — Progress Notes (Signed)
Patient was attempting to get OOB unassisted.  Patient was directed to sit on side of bed, then stand with x2 minimal assistance with rolling walker.  Stood at side of bed for 3-4 minutes, then back to bed.  Bed alarm on, pt tolerated movement very well.

## 2014-08-31 NOTE — Progress Notes (Signed)
PROGRESS NOTE  JAQUASHA CARNEVALE ZYY:[id number] DOB: 04-14-1923 DOA: 08/28/2014 PCP: No primary care provider on file.  HPI: Brianna Robbins is a 81 y.o. female with Past medical history of arthritis, essential hypertension, dementia, recurrent fall, diverticular bleeding, dementia. The patient is a resident of an assisted living facility and today she comes in with a fall. The fall was not witnessed and the patient is unable to provide any history due to dementia. She was found to have a hip fracture.  Subjective / 24 H Interval events Overnight patient with increasing oxygen requirements, febrile, this morning on a NRB  Assessment/Plan: Principal Problem:   Left displaced femoral neck fracture Active Problems:   Closed left hip fracture   Essential hypertension   Hypoxia   Hypokalemia   Dementia   HOH (hard of hearing)   Femoral neck fracture  Acute respiratory failure with hypoxia - likely multifactorial in the setting of newly diagnosed pulmonary embolism however they are small, more likely fluid overload contributing as well - She received IV Lasix 2 on 1/15, good urine output, significant improvement in her respiratory status and she is on nasal cannula this morning  Pulmonary emboli - seen on the CT angiogram chest obtained today - Discussed with orthopedic surgery team as well as family, we'll start IV heparin and closely monitor, if she has no bleeding from her surgical site can probably start oral anticoagulation tomorrow. I had an long discussion with the family today regarding risks and benefits of anticoagulation, they feel like she is going to be discharged with skilled nursing facility and the chances of her having further falls are low.  Goals of care - Discussed with her sister extensively 1/15, per family wishes they do not want very aggressive interventions, she has advanced dementia and a poor quality of life prior to this happening. She confirmed DNR/DNI status and would  not want patient moved to ICU.   UTI  - patient with history of recurrent UTIs followed by outpatient urology - urine cultures with Klebsiella sensitive to Ceftriaxone, started 1/14  Left displaced femoral neck fracture - s/p repair 1/13  Hypertension - Continue amlodipine, Ziac  Hypokalemia - Patient has received replacement potassium. - Lasix this morning, monitor BMP  Dementia - profound  Hypothyroidism - continue synthroid  Depression - continue Prozac  Diet: DIET SOFT Fluids: none DVT Prophylaxis: Aspirin  Code Status: DNR Family Communication: d/w daughter Disposition Plan: remain inpatient  Consultants:  Orthopedic surgery   Procedures:  L hemi hip arthroplasty 1/13   Antibiotics Ceftriaxone 1/14 >>   Studies  Ct Angio Chest Pe W/cm &/or Wo Cm  08/31/2014   CLINICAL DATA:  Dyspnea. Status post left hip replacement 3 days ago.  EXAM: CT ANGIOGRAPHY CHEST WITH CONTRAST  TECHNIQUE: Multidetector CT imaging of the chest was performed using the standard protocol during bolus administration of intravenous contrast. Multiplanar CT image reconstructions and MIPs were obtained to evaluate the vascular anatomy.  CONTRAST:  36mL OMNIPAQUE IOHEXOL 350 MG/ML SOLN  COMPARISON:  Portable chest obtained yesterday. Thoracic spine CT dated 10/16/2011.  FINDINGS: Small, elongated right upper lobe pulmonary artery filling defect. There is a similar, small filling defect and a left lower lobe pulmonary artery.  Moderately large hiatal hernia with an interval increase in size. Mild bilateral dependent atelectasis. No lung nodules or enlarged lymph nodes. Enlarged heart. Thoracic spine degenerative changes. Progressive, marked collapse of the previously demonstrated T12 compression fracture. Unremarkable upper abdomen.  Review of the  MIP images confirms the above findings.  IMPRESSION: 1. Small bilateral pulmonary emboli. 2. Mild bilateral dependent atelectasis. 3. Moderately large hiatal  hernia. 4. Approximately 80% compression of the previously demonstrated T12 vertebral body fracture. Critical Value/emergent results were called by telephone at the time of interpretation on 08/31/2014 at 1:27 pm to [employer], the patient's nurse,who verbally acknowledged these results. She stated that she will inform Dr. Cruzita Lederer.   Electronically Signed   By: Enrique Sack M.D.   On: 08/31/2014 13:31   Dg Chest Port 1 View  08/30/2014   CLINICAL DATA:  Acute onset of hypoxia.  Initial encounter.  EXAM: PORTABLE CHEST - 1 VIEW  COMPARISON:  Chest radiograph performed 08/28/2014  FINDINGS: There is elevation of the right hemidiaphragm. Mild bibasilar atelectasis is seen. No pleural effusion or pneumothorax is identified.  The cardiomediastinal silhouette is enlarged. A moderate to large hiatal hernia is again seen. No acute osseous abnormalities are seen.  IMPRESSION: 1. Elevation of the right hemidiaphragm. Mild bibasilar atelectasis noted. 2. Cardiomegaly. 3. Moderate to large hiatal hernia noted.   Electronically Signed   By: Garald Balding M.D.   On: 08/30/2014 05:52    Objective  Filed Vitals:   08/30/14 2125 08/31/14 0000 08/31/14 0354 08/31/14 0539  BP: 150/53   147/99  Pulse: 92   85  Temp: 101.1 F (38.4 C)   97.6 F (36.4 C)  TempSrc: Axillary   Oral  Resp: 20 20 20 20   Height:      Weight:      SpO2: 98% 98% 98% 97%    Intake/Output Summary (Last 24 hours) at 08/31/14 1352 Last data filed at 08/31/14 0257  Gross per 24 hour  Intake      0 ml  Output      0 ml  Net      0 ml   Filed Weights   08/28/14 0800  Weight: 75.3 kg (166 lb 0.1 oz)    Exam:  General:  NAD, breathing better, confused and with mild agitation  HEENT: no scleral icterus  Cardiovascular: RRR without murmurs  Respiratory: no wheezing, clear on anterior auscultaion  Abdomen: soft  MSK/Extremities: no clubbing/cyanosis  Skin: no rashes  Neuro: does not follow commands  Data Reviewed: Basic  Metabolic Panel:  Recent Labs Lab 08/28/14 0202 08/28/14 0905 08/29/14 0400 08/30/14 0505 08/31/14 0435  NA 137 137 138 130* 138  K 2.8* 3.1* 3.8 3.5 3.7  CL 102 103 98 98 100  CO2 25 26 26 24 26   GLUCOSE 173* 149* 134* 150* 107*  BUN 25* 21 22 18 23   CREATININE 0.74 0.53 0.81 0.67 0.70  CALCIUM 8.5 8.6 8.4 7.6* 8.1*  MG  --  1.6 1.5  --   --    Liver Function Tests:  Recent Labs Lab 08/28/14 0202  AST 34  ALT 26  ALKPHOS 98  BILITOT 0.6  PROT 6.9  ALBUMIN 3.9   CBC:  Recent Labs Lab 08/28/14 0202 08/29/14 0830 08/30/14 0505 08/31/14 0435  WBC 13.9* 9.5 8.3 7.8  NEUTROABS 12.0*  --   --   --   HGB 12.8 10.6* 9.6* 10.0*  HCT 39.5 32.0* 28.7* 30.0*  MCV 88.6 88.9 87.5 87.5  PLT 221 177 136* 199    Recent Results (from the past 240 hour(s))  Culture, Urine     Status: None   Collection Time: 08/28/14  9:52 AM  Result Value Ref Range Status   Specimen Description URINE,  CATHETERIZED  Final   Special Requests NONE  Final   Colony Count   Final    >=100,000 COLONIES/ML Performed at [facility]    Culture   Final    KLEBSIELLA PNEUMONIAE Performed at [facility]    Report Status 08/30/2014 FINAL  Final   Organism ID, Bacteria KLEBSIELLA PNEUMONIAE  Final      Susceptibility   Klebsiella pneumoniae - MIC*    AMPICILLIN >=32 RESISTANT Resistant     CEFAZOLIN 16 INTERMEDIATE Intermediate     CEFTRIAXONE <=1 SENSITIVE Sensitive     CIPROFLOXACIN 1 SENSITIVE Sensitive     GENTAMICIN <=1 SENSITIVE Sensitive     LEVOFLOXACIN 1 SENSITIVE Sensitive     NITROFURANTOIN 128 RESISTANT Resistant     TOBRAMYCIN <=1 SENSITIVE Sensitive     TRIMETH/SULFA <=20 SENSITIVE Sensitive     PIP/TAZO 16 SENSITIVE Sensitive     * KLEBSIELLA PNEUMONIAE  Surgical pcr screen     Status: Abnormal   Collection Time: 08/28/14 12:40 PM  Result Value Ref Range Status   MRSA, PCR NEGATIVE NEGATIVE Final   Staphylococcus aureus POSITIVE (A) NEGATIVE Final     Comment:        The Xpert SA Assay (FDA approved for NASAL specimens in patients over 53 years of age), is one component of a comprehensive surveillance program.  Test performance has been validated by [employer] for patients greater than or equal to 59 year old. It is not intended to diagnose infection nor to guide or monitor treatment.   Culture, blood (routine x 2)     Status: None (Preliminary result)   Collection Time: 08/30/14  5:05 AM  Result Value Ref Range Status   Specimen Description BLOOD LEFT HAND  Final   Special Requests   Final    BOTTLES DRAWN AEROBIC AND ANAEROBIC 10CC AER 5CC ANA   Culture   Final           BLOOD CULTURE RECEIVED NO GROWTH TO DATE CULTURE WILL BE HELD FOR 5 DAYS BEFORE ISSUING A FINAL NEGATIVE REPORT Note: Culture results may be compromised due to an excessive volume of blood received in culture bottles. Performed at [facility]    Report Status PENDING  Incomplete  Culture, blood (routine x 2)     Status: None (Preliminary result)   Collection Time: 08/30/14  5:15 AM  Result Value Ref Range Status   Specimen Description BLOOD LEFT FOREARM  Final   Special Requests BOTTLES DRAWN AEROBIC AND ANAEROBIC 5CC  Final   Culture   Final           BLOOD CULTURE RECEIVED NO GROWTH TO DATE CULTURE WILL BE HELD FOR 5 DAYS BEFORE ISSUING A FINAL NEGATIVE REPORT Performed at [facility]    Report Status PENDING  Incomplete     Scheduled Meds: . amLODipine  5 mg Oral Daily  . bisoprolol-hydrochlorothiazide  1 tablet Oral Daily  . cefTRIAXone (ROCEPHIN)  IV  1 g Intravenous Q24H  . darifenacin  7.5 mg Oral Daily  . docusate sodium  100 mg Oral BID  . FLUoxetine  40 mg Oral Daily  . heparin  5,000 Units Subcutaneous 3 times per day  . levothyroxine  112 mcg Oral QAC breakfast  . magnesium sulfate 1 - 4 g bolus IVPB  3 g Intravenous Once  . mirtazapine  7.5 mg Oral QHS  . pantoprazole  40 mg Oral Daily   Continuous Infusions: .  dextrose 5 % and 0.9% NaCl      Marzetta Board, MD [facility] Pager [phone]. If 7 PM - 7 AM, please contact night-coverage at [email or web address], password [facility] 08/31/2014, 1:52 PM  LOS: 3 days

## 2014-09-01 LAB — BASIC METABOLIC PANEL
Anion gap: 9 (ref 5–15)
BUN: 22 mg/dL (ref 6–23)
CO2: 31 mmol/L (ref 19–32)
CREATININE: 0.74 mg/dL (ref 0.50–1.10)
Calcium: 8.6 mg/dL (ref 8.4–10.5)
Chloride: 97 mEq/L (ref 96–112)
GFR calc non Af Amer: 73 mL/min — ABNORMAL LOW (ref >90)
GFR, EST AFRICAN AMERICAN: 85 mL/min — AB (ref >90)
Glucose, Bld: 115 mg/dL — ABNORMAL HIGH (ref 70–99)
POTASSIUM: 3 mmol/L — AB (ref 3.5–5.1)
Sodium: 137 mmol/L (ref 135–145)

## 2014-09-01 LAB — HEPARIN LEVEL (UNFRACTIONATED): HEPARIN UNFRACTIONATED: 0.37 [IU]/mL (ref 0.30–0.70)

## 2014-09-01 LAB — CBC
HEMATOCRIT: 28.8 % — AB (ref 36.0–46.0)
Hemoglobin: 9.5 g/dL — ABNORMAL LOW (ref 12.0–15.0)
MCH: 28.7 pg (ref 26.0–34.0)
MCHC: 33 g/dL (ref 30.0–36.0)
MCV: 87 fL (ref 78.0–100.0)
Platelets: 222 10*3/uL (ref 150–400)
RBC: 3.31 MIL/uL — AB (ref 3.87–5.11)
RDW: 14.3 % (ref 11.5–15.5)
WBC: 7.2 10*3/uL (ref 4.0–10.5)

## 2014-09-01 MED ORDER — MUPIROCIN 2 % EX OINT
1.0000 "application " | TOPICAL_OINTMENT | Freq: Two times a day (BID) | CUTANEOUS | Status: DC
  Administered 2014-09-01 – 2014-09-02 (×2): 1 via NASAL
  Filled 2014-09-01: qty 22

## 2014-09-01 MED ORDER — CHLORHEXIDINE GLUCONATE CLOTH 2 % EX PADS
6.0000 | MEDICATED_PAD | Freq: Every day | CUTANEOUS | Status: DC
  Administered 2014-09-02: 6 via TOPICAL

## 2014-09-01 MED ORDER — APIXABAN 5 MG PO TABS
10.0000 mg | ORAL_TABLET | Freq: Two times a day (BID) | ORAL | Status: DC
  Administered 2014-09-02: 10 mg via ORAL
  Filled 2014-09-01 (×2): qty 2

## 2014-09-01 MED ORDER — POTASSIUM CHLORIDE 20 MEQ PO PACK
40.0000 meq | PACK | Freq: Two times a day (BID) | ORAL | Status: AC
  Administered 2014-09-01 (×2): 40 meq via ORAL
  Filled 2014-09-01 (×2): qty 2

## 2014-09-01 MED ORDER — APIXABAN 5 MG PO TABS
10.0000 mg | ORAL_TABLET | ORAL | Status: AC
  Administered 2014-09-01: 10 mg via ORAL
  Filled 2014-09-01: qty 2

## 2014-09-01 MED ORDER — APIXABAN 5 MG PO TABS: 5.0000 mg | ORAL_TABLET | Freq: Two times a day (BID) | ORAL | Status: DC

## 2014-09-01 MED ORDER — APIXABAN 5 MG PO TABS
10.0000 mg | ORAL_TABLET | Freq: Once | ORAL | Status: AC
  Administered 2014-09-01: 10 mg via ORAL
  Filled 2014-09-01 (×2): qty 2

## 2014-09-01 NOTE — Progress Notes (Addendum)
ANTICOAGULATION CONSULT NOTE - Follow Up Consult  Pharmacy Consult for heparin Indication: pulmonary embolus  Allergies  Allergen Reactions  . Sulfa Antibiotics Rash    Patient Measurements: Height: 5' 2.5" (158.8 cm) Weight: 166 lb 0.1 oz (75.3 kg) IBW/kg (Calculated) : 51.25 Heparin Dosing Weight: 67 kg  Vital Signs: Temp: 97.5 F (36.4 C) (01/17 0522) Temp Source: Oral (01/17 0522) BP: 164/72 mmHg (01/17 0522) Pulse Rate: 91 (01/17 0522)  Labs:  Recent Labs  08/30/14 0505 08/30/14 0822 08/31/14 0435 08/31/14 2210 09/01/14 0445  HGB 9.6*  --  10.0*  --  9.5*  HCT 28.7*  --  30.0*  --  28.8*  PLT 136*  --  199  --  222  HEPARINUNFRC  --   --   --  0.64 0.37  CREATININE 0.67  --  0.70  --  0.74  TROPONINI  --  <0.03  --   --   --     Estimated Creatinine Clearance: 45.8 mL/min (by C-G formula based on Cr of 0.74).   Assessment: Patient is an 81 y.o F on heparin for new PE.  Heparin remains therapeutic this morning at 0.37.  No bleeding documented.  Goal of Therapy:  Heparin level 0.3-0.7 units/ml Monitor platelets by anticoagulation protocol: Yes   Plan:  - continue heparin drip at 1000 units/hr - Consider starting oral anticoagulant for patient if/when appropriate  Jackston [place] P 09/01/2014,10:18 AM  Adden: changing heparin to Eliquis per MD's request.  Will stop heparin now and start Eliquis 10mg  bid for 7 days then 5mg  bid

## 2014-09-01 NOTE — Progress Notes (Signed)
PATIENT ID: Brianna Robbins MRN: [id number] DOB/AGE: [birth year] / 81y.o. 4 Days Post-Op LEFT HEMI HIP per Dr Mayer Camel  PROGRESS NOTE Subjective: Patient is awake and answers some questions appropriately but still  mild dementia. Ambulating WBAT however her daughter states she has not been ambulating much. Patient denies pain in left leg/hip. Medicine team following for PE, UTI, ARF. Daughter reports improved PO intake. Pt on heparin therapy and has achieved therapeutic level followed my medicine team.  Objective: BP 164/72 mmHg  Pulse 91  Temp(Src) 97.5 F (36.4 C) (Oral)  Resp 18  Ht 5' 2.5" (1.588 m)  Wt 75.3 kg (166 lb 0.1 oz)  BMI 29.86 kg/m2  SpO2 90%    Intake/Output Summary (Last 24 hours) at 09/01/14 1237 Last data filed at 09/01/14 0705  Gross per 24 hour  Intake   1170 ml  Output      0 ml  Net   1170 ml    LABORATORY DATA: CBC Latest Ref Rng 09/01/2014 08/31/2014 08/30/2014  WBC 4.0 - 10.5 K/uL 7.2 7.8 8.3  Hemoglobin 12.0 - 15.0 g/dL 9.5(L) 10.0(L) 9.6(L)  Hematocrit 36.0 - 46.0 % 28.8(L) 30.0(L) 28.7(L)  Platelets 150 - 400 K/uL 222 199 136(L)     Examination: Intact pulses distally Incision: dressing C/D/I No cellulitis present Compartment soft XR AP&Lat of hip shows well placed\fixed THA  Assessment:  4 Days Post-Op LEFT HEMI HIP  ADDITIONAL DIAGNOSIS:PE's, Expected Acute Blood Loss Anemia, Hypertension, dementia,hypokalemia, Acute resp failure with hypoxia, uti, hypothyroid, depression. Followed by medicine team  Plan: PT/OT WBAT, THA posterior precautions, encouraged ambulation with pt and will have nursing staff ambulate in halls  DVT Prophylaxis: SCDx72 hrs, ASA 325 mg BID x 2 weeks  DISCHARGE PLAN: Skilled Nursing Facility/Rehab  -OK to D/C from orthopaedic standpoint, will await release from medical team to D/C to prior SNF residence  Real: HHPT, [place], [place] and 3-in-1 comode seat  Discussed  utilization of Boost shakes if not eating

## 2014-09-01 NOTE — Progress Notes (Signed)
Per daughter at bedside, while patient was eating dinner, she "choked" on her bread.  No change for respiratory assessment, 02 sats 98% on 1.5L.  Patient continued to drink and eat her dinner with no s/s of distress.

## 2014-09-01 NOTE — Progress Notes (Signed)
PROGRESS NOTE  Brianna Robbins QQP:[id number] DOB: 08/14/1923 DOA: 08/28/2014 PCP: No primary care provider on file.  HPI: Brianna Robbins is a 81 y.o. female with Past medical history of arthritis, essential hypertension, dementia, recurrent fall, diverticular bleeding, dementia. The patient is a resident of an assisted living facility and today she comes in with a fall. The fall was not witnessed and the patient is unable to provide any history due to dementia. She was found to have a hip fracture.  Subjective / 24 H Interval events - better this morning, able to work with PT yesterday  Assessment/Plan: Principal Problem:   Left displaced femoral neck fracture Active Problems:   Closed left hip fracture   Essential hypertension   Hypoxia   Hypokalemia   Dementia   HOH (hard of hearing)   Femoral neck fracture  Acute respiratory failure with hypoxia - likely multifactorial in the setting of newly diagnosed pulmonary embolism however they are small, more likely fluid overload contributing as well - She received IV Lasix 2 on 1/15, good urine output, significant improvement in her respiratory status and she is on nasal cannula this morning  Pulmonary emboli - seen on the CT angiogram chest - has been tolerating heparin gtt well without significant Hb drop, or bleeding at the surgical site - will start Eliquis per pharmacy today   Goals of care - Discussed with her sister extensively 1/15 when patient had the acute deterioration, per family wishes they do not want very aggressive interventions, she has advanced dementia and a poor quality of life prior to this happening. She confirmed DNR/DNI status.  UTI  - patient with history of recurrent UTIs followed by outpatient urology - urine cultures with Klebsiella sensitive to Ceftriaxone, started 1/14  Left displaced femoral neck fracture - s/p repair 1/13  Hypertension - Continue amlodipine, Ziac  Hypokalemia - Patient has received  replacement potassium. - Lasix this morning, monitor BMP  Dementia - profound  Hypothyroidism - continue synthroid  Depression - continue Prozac  Diet: DIET SOFT Fluids: none DVT Prophylaxis: Eliquis  Code Status: DNR Family Communication: d/w daughter Disposition Plan: remain inpatient, SNF 1 day  Consultants:  Orthopedic surgery   Procedures:  L hemi hip arthroplasty 1/13   Antibiotics Ceftriaxone 1/14 >>   Studies  Ct Angio Chest Pe W/cm &/or Wo Cm  08/31/2014   CLINICAL DATA:  Dyspnea. Status post left hip replacement 3 days ago.  EXAM: CT ANGIOGRAPHY CHEST WITH CONTRAST  TECHNIQUE: Multidetector CT imaging of the chest was performed using the standard protocol during bolus administration of intravenous contrast. Multiplanar CT image reconstructions and MIPs were obtained to evaluate the vascular anatomy.  CONTRAST:  81mL OMNIPAQUE IOHEXOL 350 MG/ML SOLN  COMPARISON:  Portable chest obtained yesterday. Thoracic spine CT dated 10/16/2011.  FINDINGS: Small, elongated right upper lobe pulmonary artery filling defect. There is a similar, small filling defect and a left lower lobe pulmonary artery.  Moderately large hiatal hernia with an interval increase in size. Mild bilateral dependent atelectasis. No lung nodules or enlarged lymph nodes. Enlarged heart. Thoracic spine degenerative changes. Progressive, marked collapse of the previously demonstrated T12 compression fracture. Unremarkable upper abdomen.  Review of the MIP images confirms the above findings.  IMPRESSION: 1. Small bilateral pulmonary emboli. 2. Mild bilateral dependent atelectasis. 3. Moderately large hiatal hernia. 4. Approximately 80% compression of the previously demonstrated T12 vertebral body fracture. Critical Value/emergent results were called by telephone at the time of interpretation on 08/31/2014  at 1:27 pm to Brianna Robbins, the patient's nurse,who verbally acknowledged these results. She stated that she will inform  Dr. Cruzita Robbins.   Electronically Signed   By: Brianna Robbins M.D.   On: 08/31/2014 13:31    Objective  Filed Vitals:   08/31/14 0539 08/31/14 1553 08/31/14 2055 09/01/14 0522  BP: 147/99 118/66 118/50 164/72  Pulse: 85 72 72 91  Temp: 97.6 F (36.4 C) 98.2 F (36.8 C) 97.6 F (36.4 C) 97.5 F (36.4 C)  TempSrc: Oral Oral Oral Oral  Resp: 20 18 18 18   Height:      Weight:      SpO2: 97% 95% 91% 90%    Intake/Output Summary (Last 24 hours) at 09/01/14 1241 Last data filed at 09/01/14 0705  Gross per 24 hour  Intake   1170 ml  Output      0 ml  Net   1170 ml   Filed Weights   08/28/14 0800  Weight: 75.3 kg (166 lb 0.1 oz)    Exam:  General:  NAD, sleeping but wakes up easily  HEENT: no scleral icterus  Cardiovascular: RRR without murmurs  Respiratory: no wheezing, clear on anterior auscultaion  Abdomen: soft  MSK/Extremities: no clubbing/cyanosis  Skin: no rashes  Neuro: non focal  Data Reviewed: Basic Metabolic Panel:  Recent Labs Lab 08/28/14 0905 08/29/14 0400 08/30/14 0505 08/31/14 0435 09/01/14 0445  NA 137 138 130* 138 137  K 3.1* 3.8 3.5 3.7 3.0*  CL 103 98 98 100 97  CO2 26 26 24 26 31   GLUCOSE 149* 134* 150* 107* 115*  BUN 21 22 18 23 22   CREATININE 0.53 0.81 0.67 0.70 0.74  CALCIUM 8.6 8.4 7.6* 8.1* 8.6  MG 1.6 1.5  --   --   --    Liver Function Tests:  Recent Labs Lab 08/28/14 0202  AST 34  ALT 26  ALKPHOS 98  BILITOT 0.6  PROT 6.9  ALBUMIN 3.9   CBC:  Recent Labs Lab 08/28/14 0202 08/29/14 0830 08/30/14 0505 08/31/14 0435 09/01/14 0445  WBC 13.9* 9.5 8.3 7.8 7.2  NEUTROABS 12.0*  --   --   --   --   HGB 12.8 10.6* 9.6* 10.0* 9.5*  HCT 39.5 32.0* 28.7* 30.0* 28.8*  MCV 88.6 88.9 87.5 87.5 87.0  PLT 221 177 136* 199 222    Recent Results (from the past 240 hour(s))  Culture, Urine     Status: None   Collection Time: 08/28/14  9:52 AM  Result Value Ref Range Status   Specimen Description URINE, CATHETERIZED   Final   Special Requests NONE  Final   Colony Count   Final    >=100,000 COLONIES/ML Performed at [facility]   Final    KLEBSIELLA PNEUMONIAE Performed at [facility]    Report Status 08/30/2014 FINAL  Final   Organism ID, Bacteria KLEBSIELLA PNEUMONIAE  Final      Susceptibility   Klebsiella pneumoniae - MIC*    AMPICILLIN >=32 RESISTANT Resistant     CEFAZOLIN 16 INTERMEDIATE Intermediate     CEFTRIAXONE <=1 SENSITIVE Sensitive     CIPROFLOXACIN 1 SENSITIVE Sensitive     GENTAMICIN <=1 SENSITIVE Sensitive     LEVOFLOXACIN 1 SENSITIVE Sensitive     NITROFURANTOIN 128 RESISTANT Resistant     TOBRAMYCIN <=1 SENSITIVE Sensitive     TRIMETH/SULFA <=20 SENSITIVE Sensitive     PIP/TAZO 16 SENSITIVE Sensitive     *  KLEBSIELLA PNEUMONIAE  Surgical pcr screen     Status: Abnormal   Collection Time: 08/28/14 12:40 PM  Result Value Ref Range Status   MRSA, PCR NEGATIVE NEGATIVE Final   Staphylococcus aureus POSITIVE (A) NEGATIVE Final    Comment:        The Xpert SA Assay (FDA approved for NASAL specimens in patients over 63 years of age), is one component of a comprehensive surveillance program.  Test performance has been validated by [employer] for patients greater than or equal to 66 year old. It is not intended to diagnose infection nor to guide or monitor treatment.   Culture, blood (routine x 2)     Status: None (Preliminary result)   Collection Time: 08/30/14  5:05 AM  Result Value Ref Range Status   Specimen Description BLOOD LEFT HAND  Final   Special Requests   Final    BOTTLES DRAWN AEROBIC AND ANAEROBIC 10CC AER 5CC ANA   Culture   Final           BLOOD CULTURE RECEIVED NO GROWTH TO DATE CULTURE WILL BE HELD FOR 5 DAYS BEFORE ISSUING A FINAL NEGATIVE REPORT Note: Culture results may be compromised due to an excessive volume of blood received in culture bottles. Performed at [facility]    Report Status PENDING   Incomplete  Culture, blood (routine x 2)     Status: None (Preliminary result)   Collection Time: 08/30/14  5:15 AM  Result Value Ref Range Status   Specimen Description BLOOD LEFT FOREARM  Final   Special Requests BOTTLES DRAWN AEROBIC AND ANAEROBIC 5CC  Final   Culture   Final           BLOOD CULTURE RECEIVED NO GROWTH TO DATE CULTURE WILL BE HELD FOR 5 DAYS BEFORE ISSUING A FINAL NEGATIVE REPORT Performed at [facility]    Report Status PENDING  Incomplete     Scheduled Meds: . amLODipine  5 mg Oral Daily  . bisoprolol-hydrochlorothiazide  1 tablet Oral Daily  . cefTRIAXone (ROCEPHIN)  IV  1 g Intravenous Q24H  . darifenacin  7.5 mg Oral Daily  . docusate sodium  100 mg Oral BID  . FLUoxetine  40 mg Oral Daily  . levothyroxine  112 mcg Oral QAC breakfast  . magnesium sulfate 1 - 4 g bolus IVPB  3 g Intravenous Once  . mirtazapine  7.5 mg Oral QHS  . pantoprazole  40 mg Oral Daily  . potassium chloride  40 mEq Oral BID   Continuous Infusions: . dextrose 5 % and 0.9% NaCl    . heparin 1,000 Units/hr (08/31/14 1456)    Marzetta Board, MD [facility] Pager [phone]. If 7 PM - 7 AM, please contact night-coverage at [email or web address], password [facility] 09/01/2014, 12:41 PM  LOS: 4 days

## 2014-09-02 LAB — POTASSIUM: Potassium: 3.3 mmol/L — ABNORMAL LOW (ref 3.5–5.1)

## 2014-09-02 LAB — HEMOGLOBIN AND HEMATOCRIT, BLOOD
HCT: 29.5 % — ABNORMAL LOW (ref 36.0–46.0)
Hemoglobin: 9.7 g/dL — ABNORMAL LOW (ref 12.0–15.0)

## 2014-09-02 MED ORDER — FLEET ENEMA 7-19 GM/118ML RE ENEM
1.0000 | ENEMA | Freq: Once | RECTAL | Status: AC
  Administered 2014-09-02: 1 via RECTAL
  Filled 2014-09-02: qty 1

## 2014-09-02 MED ORDER — APIXABAN 5 MG PO TABS: 5.0000 mg | ORAL_TABLET | Freq: Two times a day (BID) | ORAL | Status: DC

## 2014-09-02 MED ORDER — CIPROFLOXACIN HCL 500 MG PO TABS: 500.0000 mg | ORAL_TABLET | Freq: Two times a day (BID) | ORAL | Status: DC

## 2014-09-02 MED ORDER — APIXABAN 5 MG PO TABS: 10.0000 mg | ORAL_TABLET | Freq: Two times a day (BID) | ORAL | Status: DC

## 2014-09-02 NOTE — Progress Notes (Signed)
Report called to [facility] to [place]

## 2014-09-02 NOTE — Clinical Social Work Note (Addendum)
Patient to discharge today per MD order. Patient to discharge (back) to [facility] SN[facility] RN to call report prior to transportation to: [phone] Transportation: [facility]  Patient family has been updated regarding discharge status/plans. RN aware.  DC summary has been sent to SNF for review.  Packet on chart.  Nonnie Done, [place] [phone]  Psychiatric & Orthopedics (5N 1-16) Clinical Social Worker

## 2014-09-02 NOTE — Discharge Instructions (Signed)
Information on my medicine - ELIQUIS (apixaban)  This medication education was reviewed with me or my healthcare representative as part of my discharge preparation.  The pharmacist that spoke with me during my hospital stay was:  Tressie Ragin Kay, RPH  Why was Eliquis prescribed for you? Eliquis was prescribed to treat blood clots that may have been found in the veins of your legs (deep vein thrombosis) or in your lungs (pulmonary embolism) and to reduce the risk of them occurring again.  What do You need to know about Eliquis ? The starting dose is 10 mg (two 5 mg tablets) taken TWICE daily for the FIRST SEVEN (7) DAYS, then  the dose is reduced to ONE 5 mg tablet taken TWICE daily.  Eliquis may be taken with or without food.   Try to take the dose about the same time in the morning and in the evening. If you have difficulty swallowing the tablet whole please discuss with your pharmacist how to take the medication safely.  Take Eliquis exactly as prescribed and DO NOT stop taking Eliquis without talking to the doctor who prescribed the medication.  Stopping may increase your risk of developing a new blood clot.  Refill your prescription before you run out.  After discharge, you should have regular check-up appointments with your healthcare provider that is prescribing your Eliquis.    What do you do if you miss a dose? If a dose of ELIQUIS is not taken at the scheduled time, take it as soon as possible on the same day and twice-daily administration should be resumed. The dose should not be doubled to make up for a missed dose.  Important Safety Information A possible side effect of Eliquis is bleeding. You should call your healthcare provider right away if you experience any of the following: Bleeding from an injury or your nose that does not stop. Unusual colored urine (red or dark brown) or unusual colored stools (red or black). Unusual bruising for unknown reasons. A serious  fall or if you hit your head (even if there is no bleeding).  Some medicines may interact with Eliquis and might increase your risk of bleeding or clotting while on Eliquis. To help avoid this, consult your healthcare provider or pharmacist prior to using any new prescription or non-prescription medications, including herbals, vitamins, non-steroidal anti-inflammatory drugs (NSAIDs) and supplements.  This website has more information on Eliquis (apixaban): http://www.eliquis.com/eliquis/home  

## 2014-09-02 NOTE — Clinical Social Work Psychosocial (Signed)
Clinical Social Work Department BRIEF PSYCHOSOCIAL ASSESSMENT 09/02/2014  Patient:  Brianna Robbins, Brianna Robbins     Account Number:  [id number]     Admit date:  08/28/2014  Clinical Social Worker:  Wylene Men  Date/Time:  08/29/2014 12:32 PM  Referred by:  Physician  Date Referred:  08/29/2014 Referred for  SNF Placement  Psychosocial assessment   Other Referral:   none   Interview type:  Other - See comment Other interview type:   patient's sister Brianna Robbins and patient's neice, Brianna Robbins.  Of note, patient's sister is POA    PSYCHOSOCIAL DATA Living Status:  FACILITY Admitted from facility:   Level of care:  [place] Primary support name:  Brianna Robbins Primary support relationship to patient:  [place] Degree of support available:   strong    CURRENT CONCERNS Current Concerns  Post-Acute Placement   Other Concerns:   patient had a medical decline late last week (1/14-1/15) and patient was deemed to be comfort care.  Patient has since stabilized.    SOCIAL WORK ASSESSMENT / PLAN CSW met with patient, patient' sister Brianna Robbins and patient's neice at bedside.  Patient is hard of hearing and at the time of the assessment only oriented to self.  Patient sister, Brianna Robbins is health care power of attorney and Brianna Robbins, neice is also very involved in the patient's care.  patient is from [place] and will return once medically discharged.  CSW has confirmed with with SNF. Patient will be transported via [facility] once medically stable for discharge.  Patient's sister Brianna Robbins is very realistic regarding patient's prognosis.  Patient is DNR.  Brianna Robbins reports having been POA for patient's late husband. Patient's husband has recently passed.  Brianna Robbins seemed to be managing and navigating resources with an optimisitic perspective.   Assessment/plan status:  Psychosocial Support/Ongoing Assessment of Needs Other assessment/ plan:   FL2  PASSARR- existing   Information/referral to community  resources:   SNF/ SNR-LTC    PATIENT'S/FAMILY'S RESPONSE TO PLAN OF CARE: Patient's sister, Brianna Robbins, [place] is agreeable for patient to return to [facility] once medically stable.

## 2014-09-02 NOTE — Discharge Summary (Signed)
Physician Discharge Summary  DYLLAN KATS SFK:[id number] DOB: June 02, 1923 DOA: 08/28/2014  PCP: No primary care provider on file.  Admit date: 08/28/2014 Discharge date: 09/02/2014  Time spent: 45 minutes  Recommendations for Outpatient Follow-up:  1. Please follow up with Dr. Mayer Camel as scheduled 2. Please follow up with PCP in 1 week 3. Patient discharged on Eliquis for PE, please use 10 mg BID for 6 days followed by 5 mg BID per pharmacy. Closely monitor for any signs of bleeding   Discharge Diagnoses:  Principal Problem:   Left displaced femoral neck fracture Active Problems:   Closed left hip fracture   Essential hypertension   Hypoxia   Hypokalemia   Dementia   HOH (hard of hearing)   Femoral neck fracture   Discharge Condition: stable  Diet recommendation: regular  Filed Weights   08/28/14 0800  Weight: 75.3 kg (166 lb 0.1 oz)    History of present illness:  Brianna Robbins is a 81 y.o. female with Past medical history of arthritis, essential hypertension, dementia, recurrent fall, diverticular bleeding, dementia. The patient is a resident of an assisted living facility and today she comes in with a fall. The fall was not witnessed and the patient is unable to provide any history due to dementia. Patient was seen earlier by her sister during the day and at which time she did not have any acute complaints. There is no reported history of medication change, fever, chills, nausea, vomiting, diarrhea. Patient did not have any cough or shortness of breath prior to this episode as well. Patient the time of my evaluation denies any complaint of headache, neck pain, focal deficit, chest pain, abdominal pain. Patient denies any burning urination.Prior to the fall patient's daughter was also discussing with the patient on the phone and she also was  supposed to be seen by a CNA today as it was her day of bathing.  The patient is coming from assisted living facility. And at her baseline  dependent for most of her ADL.  Hospital Course:  Acute respiratory failure with hypoxia - likely multifactorial in the setting of newly diagnosed pulmonary embolism however they are small, more likely fluid overload contributing as well, She received IV Lasix 2 on 1/15, good urine output, significant improvement in her respiratory status and she is on nasal cannula this morning. I have discussed extensively with the family regarding anticoagulation, risks/benefits, and per their wishes will start oral anticoagulation for now. Closely monitor patient, please consider repeating a CBC in 3-4 days, monitor for falls.  Pulmonary emboli - seen on the CT angiogram chest, has been tolerating heparin gtt well without significant Hb drop, or bleeding at the surgical site, started on Eliquis per pharmacy Goals of care - Discussed with her sister extensively 1/15 when patient had the acute deterioration, per family wishes they do not want very aggressive interventions, she has advanced dementia and a poor quality of life prior to this happening. She confirmed DNR/DNI status. UTI - patient with history of recurrent UTIs followed by outpatient urology, urine cultures with Klebsiella sensitive to Ceftriaxone, started 1/14 Left displaced femoral neck fracture - s/p repair 1/13, continue PT Hypertension - Continue amlodipine, Ziac Hypokalemia - Patient has received replacement potassium. Dementia - profound Hypothyroidism - continue synthroid Depression - continue Prozac  Procedures:  Hip repair   Consultations:  Orthopedic surgery   Discharge Exam: Filed Vitals:   09/01/14 1200 09/01/14 1525 09/01/14 2021 09/02/14 0621  BP:  128/56 154/76  153/73  Pulse:  74 77 82  Temp:  98.2 F (36.8 C) 98.1 F (36.7 C) 97.9 F (36.6 C)  TempSrc:  Oral Oral Oral  Resp: 16 16 18 18   Height:      Weight:      SpO2: 99% 92% 99% 93%    General: NAD Cardiovascular: RRR Respiratory: CTA biL  Discharge  Instructions  Discharge Instructions    Weight bearing as tolerated    Complete by:  As directed             Medication List    STOP taking these medications        aspirin EC 81 MG tablet      TAKE these medications        acetaminophen 325 MG tablet  Commonly known as:  TYLENOL  Take 650 mg by mouth 3 (three) times daily. 8 am, 1 pm, & 8 pm     amLODipine 5 MG tablet  Commonly known as:  NORVASC  Take 5 mg by mouth daily.     apixaban 5 MG Tabs tablet  Commonly known as:  ELIQUIS  Take 2 tablets (10 mg total) by mouth 2 (two) times daily. For 6 days then 5 mg BID     apixaban 5 MG Tabs tablet  Commonly known as:  ELIQUIS  Take 1 tablet (5 mg total) by mouth 2 (two) times daily. Start after 6 days of 10 mg BID  Start taking on:  09/08/2014     bisoprolol-hydrochlorothiazide 5-6.25 MG per tablet  Commonly known as:  ZIAC  Take 1 tablet by mouth daily.     ciprofloxacin 500 MG tablet  Commonly known as:  CIPRO  Take 1 tablet (500 mg total) by mouth 2 (two) times daily. For 2 additional days     Cranberry 425 MG Caps  Take 850 mg by mouth 2 (two) times daily.     FLUoxetine 40 MG capsule  Commonly known as:  PROZAC  Take 40 mg by mouth daily.     HYDROcodone-acetaminophen 5-325 MG per tablet  Commonly known as:  NORCO  Take 1 tablet by mouth every 6 (six) hours as needed.     levothyroxine 112 MCG tablet  Commonly known as:  SYNTHROID, LEVOTHROID  Take 112 mcg by mouth daily before breakfast.     LORazepam 0.5 MG tablet  Commonly known as:  ATIVAN  Take 0.5 mg by mouth every 8 (eight) hours as needed. As needed for anxiety     mirtazapine 7.5 MG tablet  Commonly known as:  REMERON  Take 7.5 mg by mouth at bedtime.     omeprazole 20 MG capsule  Commonly known as:  PRILOSEC  Take 20 mg by mouth daily.     PROBIOTIC PO  Take 1 capsule by mouth daily.     solifenacin 5 MG tablet  Commonly known as:  VESICARE  Take 5 mg by mouth daily.      tizanidine 2 MG capsule  Commonly known as:  ZANAFLEX  Take 1 capsule (2 mg total) by mouth 3 (three) times daily.     traMADol 50 MG tablet  Commonly known as:  ULTRAM  Take 50 mg by mouth every 6 (six) hours as needed (for pain.).     Vitamin D3 1000 UNITS Caps  Take 1,000 Units by mouth daily.           Follow-up Information    Follow up with Kerin Salen, MD  In 2 weeks.   Specialty:  Orthopedic Surgery   Contact information:   [place] [address] [phone]       Follow up with PCP In 2 weeks.      The results of significant diagnostics from this hospitalization (including imaging, microbiology, ancillary and laboratory) are listed below for reference.    Significant Diagnostic Studies: Dg Chest 1 View  08/28/2014   CLINICAL DATA:  Status post fall; concern for chest injury. Initial encounter.  EXAM: CHEST - 1 VIEW  COMPARISON:  Chest radiograph and CT of the thoracic spine performed 10/16/2011  FINDINGS: The lungs are mildly hypoexpanded. Vascular crowding and vascular congestion are seen. Minimal bilateral atelectasis is noted. There is no evidence of pleural effusion or pneumothorax.  The cardiomediastinal silhouette is mildly enlarged. No acute osseous abnormalities are seen. Calcification overlying the lateral aspect of the left humeral head is compatible with calcific tendinitis.  IMPRESSION: 1. Lungs mildly hypoexpanded. Vascular congestion and mild cardiomegaly. Mild bilateral atelectasis noted. 2. No displaced rib fracture seen. 3. Changes of calcific tendinitis overlying the left humeral head.   Electronically Signed   By: Garald Balding M.D.   On: 08/28/2014 04:27   Ct Head Wo Contrast  08/28/2014   CLINICAL DATA:  Status post fall; found on floor. Diffuse headache. Concern for cervical spine injury. Initial encounter.  EXAM: CT HEAD WITHOUT CONTRAST  CT CERVICAL SPINE WITHOUT CONTRAST  TECHNIQUE: Multidetector CT imaging of the head and cervical  spine was performed following the standard protocol without intravenous contrast. Multiplanar CT image reconstructions of the cervical spine were also generated.  COMPARISON:  MRI of the brain performed 02/29/2008, and CT of the head performed 02/28/2008  FINDINGS: CT HEAD FINDINGS  There is no evidence of acute infarction, mass lesion, or intra- or extra-axial hemorrhage on CT.  Prominence of the ventricles and sulci reflects mild to moderate cortical volume loss. Mild cerebellar atrophy is noted. Mild periventricular white matter change likely reflects small vessel ischemic microangiopathy.  The brainstem and fourth ventricle are within normal limits. The basal ganglia are unremarkable in appearance. The cerebral hemispheres demonstrate grossly normal gray-white differentiation. No mass effect or midline shift is seen.  There is no evidence of fracture; visualized osseous structures are unremarkable in appearance. The orbits are within normal limits. There is opacification of the right mastoid air cells. The paranasal sinuses and left mastoid air cells are well-aerated. No significant soft tissue abnormalities are seen.  CT CERVICAL SPINE FINDINGS  There is no evidence of acute fracture or subluxation. Vertebral bodies demonstrate normal height. There is mild grade 1 anterolisthesis of C4 on C5, and disc space narrowing is noted at C5-C6, with associated anterior and posterior disc osteophyte complexes. Mild facet disease is noted along the cervical spine. Prevertebral soft tissues are within normal limits.  The thyroid gland is unremarkable in appearance. Mild interstitial prominence is noted at the lung apices. Dense calcification is seen at the carotid bifurcations, with likely moderate bilateral luminal narrowing.  IMPRESSION: 1. No evidence of traumatic intracranial injury or fracture. 2. No evidence of acute fracture or subluxation along the cervical spine. 3. Mild to moderate cortical volume loss and  scattered small vessel ischemic microangiopathy. 4. Opacification of the right mastoid air cells. 5. Mild degenerative change along the cervical spine. 6. Mild interstitial prominence at the lung apices. 7. Dense calcification at the carotid bifurcations, with likely moderate bilateral luminal narrowing. Carotid ultrasound would be helpful for further  evaluation, when and as deemed clinically appropriate.   Electronically Signed   By: Garald Balding M.D.   On: 08/28/2014 02:51   Ct Angio Chest Pe W/cm &/or Wo Cm  08/31/2014   CLINICAL DATA:  Dyspnea. Status post left hip replacement 3 days ago.  EXAM: CT ANGIOGRAPHY CHEST WITH CONTRAST  TECHNIQUE: Multidetector CT imaging of the chest was performed using the standard protocol during bolus administration of intravenous contrast. Multiplanar CT image reconstructions and MIPs were obtained to evaluate the vascular anatomy.  CONTRAST:  44mL OMNIPAQUE IOHEXOL 350 MG/ML SOLN  COMPARISON:  Portable chest obtained yesterday. Thoracic spine CT dated 10/16/2011.  FINDINGS: Small, elongated right upper lobe pulmonary artery filling defect. There is a similar, small filling defect and a left lower lobe pulmonary artery.  Moderately large hiatal hernia with an interval increase in size. Mild bilateral dependent atelectasis. No lung nodules or enlarged lymph nodes. Enlarged heart. Thoracic spine degenerative changes. Progressive, marked collapse of the previously demonstrated T12 compression fracture. Unremarkable upper abdomen.  Review of the MIP images confirms the above findings.  IMPRESSION: 1. Small bilateral pulmonary emboli. 2. Mild bilateral dependent atelectasis. 3. Moderately large hiatal hernia. 4. Approximately 80% compression of the previously demonstrated T12 vertebral body fracture. Critical Value/emergent results were called by telephone at the time of interpretation on 08/31/2014 at 1:27 pm to [facility], the patient's nurse,who verbally acknowledged these results.  She stated that she will inform Dr. Cruzita Lederer.   Electronically Signed   By: Enrique Sack M.D.   On: 08/31/2014 13:31   Ct Cervical Spine Wo Contrast  08/28/2014   CLINICAL DATA:  Status post fall; found on floor. Diffuse headache. Concern for cervical spine injury. Initial encounter.  EXAM: CT HEAD WITHOUT CONTRAST  CT CERVICAL SPINE WITHOUT CONTRAST  TECHNIQUE: Multidetector CT imaging of the head and cervical spine was performed following the standard protocol without intravenous contrast. Multiplanar CT image reconstructions of the cervical spine were also generated.  COMPARISON:  MRI of the brain performed 02/29/2008, and CT of the head performed 02/28/2008  FINDINGS: CT HEAD FINDINGS  There is no evidence of acute infarction, mass lesion, or intra- or extra-axial hemorrhage on CT.  Prominence of the ventricles and sulci reflects mild to moderate cortical volume loss. Mild cerebellar atrophy is noted. Mild periventricular white matter change likely reflects small vessel ischemic microangiopathy.  The brainstem and fourth ventricle are within normal limits. The basal ganglia are unremarkable in appearance. The cerebral hemispheres demonstrate grossly normal gray-white differentiation. No mass effect or midline shift is seen.  There is no evidence of fracture; visualized osseous structures are unremarkable in appearance. The orbits are within normal limits. There is opacification of the right mastoid air cells. The paranasal sinuses and left mastoid air cells are well-aerated. No significant soft tissue abnormalities are seen.  CT CERVICAL SPINE FINDINGS  There is no evidence of acute fracture or subluxation. Vertebral bodies demonstrate normal height. There is mild grade 1 anterolisthesis of C4 on C5, and disc space narrowing is noted at C5-C6, with associated anterior and posterior disc osteophyte complexes. Mild facet disease is noted along the cervical spine. Prevertebral soft tissues are within normal  limits.  The thyroid gland is unremarkable in appearance. Mild interstitial prominence is noted at the lung apices. Dense calcification is seen at the carotid bifurcations, with likely moderate bilateral luminal narrowing.  IMPRESSION: 1. No evidence of traumatic intracranial injury or fracture. 2. No evidence of acute fracture or subluxation along the  cervical spine. 3. Mild to moderate cortical volume loss and scattered small vessel ischemic microangiopathy. 4. Opacification of the right mastoid air cells. 5. Mild degenerative change along the cervical spine. 6. Mild interstitial prominence at the lung apices. 7. Dense calcification at the carotid bifurcations, with likely moderate bilateral luminal narrowing. Carotid ultrasound would be helpful for further evaluation, when and as deemed clinically appropriate.   Electronically Signed   By: Garald Balding M.D.   On: 08/28/2014 02:51   Pelvis Portable  08/28/2014   CLINICAL DATA:  Status post hemiarthroplasty of the left hip  EXAM: PORTABLE PELVIS 1-2 VIEWS  COMPARISON:  None.  FINDINGS: Interval left hemiarthroplasty without failure or complication. There is no fracture or dislocation. Postsurgical changes in the surrounding soft tissues.  IMPRESSION: Interval left hemi arthroplasty.   Electronically Signed   By: Kathreen Devoid   On: 08/28/2014 19:29   Dg Chest Port 1 View  08/30/2014   CLINICAL DATA:  Acute onset of hypoxia.  Initial encounter.  EXAM: PORTABLE CHEST - 1 VIEW  COMPARISON:  Chest radiograph performed 08/28/2014  FINDINGS: There is elevation of the right hemidiaphragm. Mild bibasilar atelectasis is seen. No pleural effusion or pneumothorax is identified.  The cardiomediastinal silhouette is enlarged. A moderate to large hiatal hernia is again seen. No acute osseous abnormalities are seen.  IMPRESSION: 1. Elevation of the right hemidiaphragm. Mild bibasilar atelectasis noted. 2. Cardiomegaly. 3. Moderate to large hiatal hernia noted.    Electronically Signed   By: Garald Balding M.D.   On: 08/30/2014 05:52   Dg Foot Complete Right  08/28/2014   CLINICAL DATA:  Status post fall; acute onset of right foot pain. Initial encounter.  EXAM: RIGHT FOOT - 3 VIEWS  COMPARISON:  None.  FINDINGS: There is no evidence of fracture or dislocation. There is chronic flattening of the subtalar joint. There is no evidence of talar subluxation. A prominent posterior calcaneal spur is noted.  Diffuse soft tissue swelling is noted about the distal lower leg.  IMPRESSION: 1. No evidence of fracture or dislocation. 2. Chronic flattening of the subtalar joint noted.   Electronically Signed   By: Garald Balding M.D.   On: 08/28/2014 04:31   Dg Hip Unilat With Pelvis 2-3 Views Left  08/28/2014   CLINICAL DATA:  Status post fall; acute onset of left hip pain. Initial encounter.  EXAM: LEFT HIP - 2+ VIEWS  COMPARISON:  Abdominal radiograph performed 08/12/2008  FINDINGS: There is a displaced subcapital fracture through the left femoral neck, with superior displacement of the distal femur. The left femoral head remains seated at the acetabulum. The right hip joint is grossly unremarkable.  Mild degenerative change is noted at the pubic symphysis. The sacroiliac joints are unremarkable. Minimal degenerative change is seen along the lower lumbar spine. The visualized bowel gas pattern is grossly unremarkable.  IMPRESSION: Displaced subcapital fracture through the left femoral neck, with superior displacement of the distal femur.   Electronically Signed   By: Garald Balding M.D.   On: 08/28/2014 04:35    Microbiology: Recent Results (from the past 240 hour(s))  Culture, Urine     Status: None   Collection Time: 08/28/14  9:52 AM  Result Value Ref Range Status   Specimen Description URINE, CATHETERIZED  Final   Special Requests NONE  Final   Colony Count   Final    >=100,000 COLONIES/ML Performed at [facility]   Final  KLEBSIELLA  PNEUMONIAE Performed at [facility]    Report Status 08/30/2014 FINAL  Final   Organism ID, Bacteria KLEBSIELLA PNEUMONIAE  Final      Susceptibility   Klebsiella pneumoniae - MIC*    AMPICILLIN >=32 RESISTANT Resistant     CEFAZOLIN 16 INTERMEDIATE Intermediate     CEFTRIAXONE <=1 SENSITIVE Sensitive     CIPROFLOXACIN 1 SENSITIVE Sensitive     GENTAMICIN <=1 SENSITIVE Sensitive     LEVOFLOXACIN 1 SENSITIVE Sensitive     NITROFURANTOIN 128 RESISTANT Resistant     TOBRAMYCIN <=1 SENSITIVE Sensitive     TRIMETH/SULFA <=20 SENSITIVE Sensitive     PIP/TAZO 16 SENSITIVE Sensitive     * KLEBSIELLA PNEUMONIAE  Surgical pcr screen     Status: Abnormal   Collection Time: 08/28/14 12:40 PM  Result Value Ref Range Status   MRSA, PCR NEGATIVE NEGATIVE Final   Staphylococcus aureus POSITIVE (A) NEGATIVE Final    Comment:        The Xpert SA Assay (FDA approved for NASAL specimens in patients over 45 years of age), is one component of a comprehensive surveillance program.  Test performance has been validated by [employer] for patients greater than or equal to 55 year old. It is not intended to diagnose infection nor to guide or monitor treatment.   Culture, blood (routine x 2)     Status: None (Preliminary result)   Collection Time: 08/30/14  5:05 AM  Result Value Ref Range Status   Specimen Description BLOOD LEFT HAND  Final   Special Requests   Final    BOTTLES DRAWN AEROBIC AND ANAEROBIC 10CC AER 5CC ANA   Culture   Final           BLOOD CULTURE RECEIVED NO GROWTH TO DATE CULTURE WILL BE HELD FOR 5 DAYS BEFORE ISSUING A FINAL NEGATIVE REPORT Note: Culture results may be compromised due to an excessive volume of blood received in culture bottles. Performed at [facility]    Report Status PENDING  Incomplete  Culture, blood (routine x 2)     Status: None (Preliminary result)   Collection Time: 08/30/14  5:15 AM  Result Value Ref Range Status   Specimen  Description BLOOD LEFT FOREARM  Final   Special Requests BOTTLES DRAWN AEROBIC AND ANAEROBIC 5CC  Final   Culture   Final           BLOOD CULTURE RECEIVED NO GROWTH TO DATE CULTURE WILL BE HELD FOR 5 DAYS BEFORE ISSUING A FINAL NEGATIVE REPORT Performed at [facility]    Report Status PENDING  Incomplete     Labs: Basic Metabolic Panel:  Recent Labs Lab 08/28/14 0905 08/29/14 0400 08/30/14 0505 08/31/14 0435 09/01/14 0445 09/02/14 0516  NA 137 138 130* 138 137  --   K 3.1* 3.8 3.5 3.7 3.0* 3.3*  CL 103 98 98 100 97  --   CO2 26 26 24 26 31   --   GLUCOSE 149* 134* 150* 107* 115*  --   BUN 21 22 18 23 22   --   CREATININE 0.53 0.81 0.67 0.70 0.74  --   CALCIUM 8.6 8.4 7.6* 8.1* 8.6  --   MG 1.6 1.5  --   --   --   --    Liver Function Tests:  Recent Labs Lab 08/28/14 0202  AST 34  ALT 26  ALKPHOS 98  BILITOT 0.6  PROT 6.9  ALBUMIN 3.9  CBC:  Recent Labs Lab 08/28/14 0202 08/29/14 0830 08/30/14 0505 08/31/14 0435 09/01/14 0445 09/02/14 0516  WBC 13.9* 9.5 8.3 7.8 7.2  --   NEUTROABS 12.0*  --   --   --   --   --   HGB 12.8 10.6* 9.6* 10.0* 9.5* 9.7*  HCT 39.5 32.0* 28.7* 30.0* 28.8* 29.5*  MCV 88.6 88.9 87.5 87.5 87.0  --   PLT 221 177 136* 199 222  --    Cardiac Enzymes:  Recent Labs Lab 08/30/14 0822  TROPONINI <0.03     Signed:  Marzetta Board  [facility] 09/02/2014, 10:10 AM

## 2014-09-02 NOTE — Progress Notes (Signed)
Physical Therapy Treatment Patient Details Name: Brianna Robbins MRN: [id number] DOB: [birth year] Today's Date: 09/02/2014    History of Present Illness Pt is a 81 y.o female with dementia who suffered a fall at ALF. pt  is s/p Lt THA of femoral neck fx.     PT Comments    Pt with improved ability to ambulate this date. Due to pt with h/o dementia pt with limited attn span to stay focused on exercises. Pt con't to be appropriate for SNF upon d/c for maximal functional recovery.   Follow Up Recommendations  SNF;Supervision/Assistance - 24 hour     Equipment Recommendations       Recommendations for Other Services       Precautions / Restrictions Precautions Precautions: Posterior Hip;Fall Precaution Comments: pt with no recall due to dementia Restrictions Weight Bearing Restrictions: Yes LLE Weight Bearing: Non weight bearing    Mobility  Bed Mobility Overal bed mobility: Needs Assistance;+2 for physical assistance Bed Mobility: Supine to Sit     Supine to sit: Mod assist;+2 for physical assistance     General bed mobility comments: max directional v/cs for directiona, assist for trunk elevation  Transfers Overall transfer level: Needs assistance Equipment used: 2 person hand held assist Transfers: Sit to/from Stand;Stand Pivot Transfers Sit to Stand: +2 physical assistance;Mod assist Stand pivot transfers: Mod assist;+2 physical assistance       General transfer comment: pt initiated transfer well  Ambulation/Gait Ambulation/Gait assistance: Mod assist;+2 physical assistance Ambulation Distance (Feet): 20 Feet Assistive device: Rolling walker (2 wheeled) Gait Pattern/deviations: Step-to pattern;Decreased stride length;Decreased stance time - left Gait velocity: slow   General Gait Details: assist to advance R LE due to limited L LE WBing, assist to keep forward momentum and walker management   Stairs            Wheelchair Mobility    Modified  Rankin (Stroke Patients Only)       Balance           Standing balance support: During functional activity Standing balance-Leahy Scale: Zero Standing balance comment: 2 person assist                    Cognition Arousal/Alertness: Awake/alert Behavior During Therapy: WFL for tasks assessed/performed Overall Cognitive Status: History of cognitive impairments - at baseline       Memory: Decreased short-term memory;Decreased recall of precautions              Exercises      General Comments        Pertinent Vitals/Pain Pain Assessment: No/denies pain    Home Living                      Prior Function            PT Goals (current goals can now be found in the care plan section) Progress towards PT goals: Progressing toward goals    Frequency  Min 3X/week    PT Plan Current plan remains appropriate    Co-evaluation             End of Session Equipment Utilized During Treatment: Gait belt Activity Tolerance: Patient tolerated treatment well Patient left: in chair;with call bell/phone within reach;with family/visitor present     Time: 3716-9678 PT Time Calculation (min) (ACUTE ONLY): 21 min  Charges:  $Gait Training: 8-22 mins  G CodesKingsley Callander 09/02/2014, 2:48 PM  Kittie Plater, PT, DPT Pager #: [phone] Office #: [phone]

## 2014-09-03 ENCOUNTER — Encounter (HOSPITAL_COMMUNITY): Payer: Self-pay | Admitting: Orthopedic Surgery

## 2014-09-03 NOTE — Progress Notes (Signed)
CARE MANAGEMENT NOTE 09/03/2014  Patient:  Brianna Robbins, Brianna Robbins   Account Number:  [id number]  Date Initiated:  08/29/2014  Documentation initiated by:  [facility]  Subjective/Objective Assessment:   left femoral neck fx, s/p left hip hemi-arthroplasty  from [place]     Action/Plan:   PT/OT evals-recommended SNF   Anticipated DC Date:  09/02/2014   Anticipated DC Plan:  [place]  In-house referral  Clinical Social Worker      DC Planning Services  CM consult      Choice offered to / List presented to:             Status of service:  Completed, signed off Medicare Important Message given?  YES (If response is "NO", the following Medicare IM given date fields will be blank) Date Medicare IM given:  09/02/2014 Medicare IM given by:  Marylyn Ishihara Date Additional Medicare IM given:   Additional Medicare IM given by:    Discharge Disposition:  [place]  Per UR Regulation:  Reviewed for med. necessity/level of care/duration of stay  If discussed at [place] of Stay Meetings, dates discussed:    Comments:  08/29/14 PT recommended SNF. Referral made to CSW.

## 2014-09-05 LAB — CULTURE, BLOOD (ROUTINE X 2)
CULTURE: NO GROWTH
CULTURE: NO GROWTH

## 2014-10-08 ENCOUNTER — Inpatient Hospital Stay (HOSPITAL_COMMUNITY)
Admission: EM | Admit: 2014-10-08 | Discharge: 2014-10-12 | DRG: 378 | Disposition: A | Discharge status: Skilled Nursing Facility | Payer: Medicare Other | Attending: Internal Medicine | Admitting: Internal Medicine

## 2014-10-08 ENCOUNTER — Encounter (HOSPITAL_COMMUNITY): Payer: Self-pay | Admitting: Emergency Medicine

## 2014-10-08 DIAGNOSIS — G309 Alzheimer's disease, unspecified: Secondary | ICD-10-CM | POA: Diagnosis present

## 2014-10-08 DIAGNOSIS — E876 Hypokalemia: Secondary | ICD-10-CM | POA: Diagnosis present

## 2014-10-08 DIAGNOSIS — Z86711 Personal history of pulmonary embolism: Secondary | ICD-10-CM

## 2014-10-08 DIAGNOSIS — I10 Essential (primary) hypertension: Secondary | ICD-10-CM | POA: Diagnosis present

## 2014-10-08 DIAGNOSIS — F419 Anxiety disorder, unspecified: Secondary | ICD-10-CM | POA: Diagnosis present

## 2014-10-08 DIAGNOSIS — F0280 Dementia in other diseases classified elsewhere without behavioral disturbance: Secondary | ICD-10-CM | POA: Diagnosis present

## 2014-10-08 DIAGNOSIS — R000 Tachycardia, unspecified: Secondary | ICD-10-CM | POA: Diagnosis present

## 2014-10-08 DIAGNOSIS — K449 Diaphragmatic hernia without obstruction or gangrene: Secondary | ICD-10-CM | POA: Diagnosis present

## 2014-10-08 DIAGNOSIS — K210 Gastro-esophageal reflux disease with esophagitis: Secondary | ICD-10-CM | POA: Diagnosis present

## 2014-10-08 DIAGNOSIS — K920 Hematemesis: Secondary | ICD-10-CM | POA: Diagnosis not present

## 2014-10-08 DIAGNOSIS — K219 Gastro-esophageal reflux disease without esophagitis: Secondary | ICD-10-CM | POA: Diagnosis present

## 2014-10-08 DIAGNOSIS — F0390 Unspecified dementia without behavioral disturbance: Secondary | ICD-10-CM | POA: Diagnosis present

## 2014-10-08 DIAGNOSIS — Z8673 Personal history of transient ischemic attack (TIA), and cerebral infarction without residual deficits: Secondary | ICD-10-CM

## 2014-10-08 DIAGNOSIS — F329 Major depressive disorder, single episode, unspecified: Secondary | ICD-10-CM | POA: Diagnosis present

## 2014-10-08 DIAGNOSIS — K2210 Ulcer of esophagus without bleeding: Secondary | ICD-10-CM | POA: Diagnosis present

## 2014-10-08 DIAGNOSIS — Z90710 Acquired absence of both cervix and uterus: Secondary | ICD-10-CM

## 2014-10-08 DIAGNOSIS — E039 Hypothyroidism, unspecified: Secondary | ICD-10-CM | POA: Diagnosis present

## 2014-10-08 DIAGNOSIS — Z66 Do not resuscitate: Secondary | ICD-10-CM | POA: Diagnosis present

## 2014-10-08 DIAGNOSIS — E785 Hyperlipidemia, unspecified: Secondary | ICD-10-CM | POA: Diagnosis present

## 2014-10-08 DIAGNOSIS — Z7901 Long term (current) use of anticoagulants: Secondary | ICD-10-CM

## 2014-10-08 DIAGNOSIS — N390 Urinary tract infection, site not specified: Secondary | ICD-10-CM

## 2014-10-08 DIAGNOSIS — Z96649 Presence of unspecified artificial hip joint: Secondary | ICD-10-CM | POA: Diagnosis present

## 2014-10-08 DIAGNOSIS — D62 Acute posthemorrhagic anemia: Secondary | ICD-10-CM | POA: Diagnosis present

## 2014-10-08 DIAGNOSIS — Z86010 Personal history of colonic polyps: Secondary | ICD-10-CM

## 2014-10-08 DIAGNOSIS — Z882 Allergy status to sulfonamides status: Secondary | ICD-10-CM

## 2014-10-08 DIAGNOSIS — I2699 Other pulmonary embolism without acute cor pulmonale: Secondary | ICD-10-CM | POA: Diagnosis present

## 2014-10-08 DIAGNOSIS — M1990 Unspecified osteoarthritis, unspecified site: Secondary | ICD-10-CM | POA: Diagnosis present

## 2014-10-08 LAB — CBC WITH DIFFERENTIAL/PLATELET
Basophils Absolute: 0 10*3/uL (ref 0.0–0.1)
Basophils Relative: 1 % (ref 0–1)
EOS PCT: 2 % (ref 0–5)
Eosinophils Absolute: 0.1 10*3/uL (ref 0.0–0.7)
HCT: 38.2 % (ref 36.0–46.0)
Hemoglobin: 12.3 g/dL (ref 12.0–15.0)
Lymphocytes Relative: 17 % (ref 12–46)
Lymphs Abs: 1.3 10*3/uL (ref 0.7–4.0)
MCH: 27.6 pg (ref 26.0–34.0)
MCHC: 32.2 g/dL (ref 30.0–36.0)
MCV: 85.8 fL (ref 78.0–100.0)
Monocytes Absolute: 1 10*3/uL (ref 0.1–1.0)
Monocytes Relative: 13 % — ABNORMAL HIGH (ref 3–12)
NEUTROS PCT: 67 % (ref 43–77)
Neutro Abs: 5.3 10*3/uL (ref 1.7–7.7)
PLATELETS: 367 10*3/uL (ref 150–400)
RBC: 4.45 MIL/uL (ref 3.87–5.11)
RDW: 15 % (ref 11.5–15.5)
WBC: 7.7 10*3/uL (ref 4.0–10.5)

## 2014-10-08 LAB — SAMPLE TO BLOOD BANK

## 2014-10-08 NOTE — ED Notes (Signed)
Patient is Alert and Oriented to person only. Fidgets with items on person and bed. Fall risk is high and staff has been made aware. Denies N&V but vomited at [place]. A Fib is present on Cardiac assessment but is not a part of medical history. MD has assessed at bedside.

## 2014-10-08 NOTE — ED Provider Notes (Signed)
CSN: [id number]     Arrival date & time 10/08/14  2321 History  This chart was scribed for Kalman Drape, MD by Hilda Lias, ED Scribe. This patient was seen in room B19C/B19C and the patient's care was started at 11:33 PM.    Chief Complaint  Patient presents with  . GI Bleeding      The history is provided by the patient and the nursing home. No language interpreter was used.     HPI Comments: Brianna Robbins is a 81 y.o. female who presents to the Emergency Department per EMS complaining of vomiting dark-colored blood that occurred 30 mins PTA. Information reported from her Nursing Home. Pt recently had a PE and began using new medication, Eliquis, as a blood thinner. Pt states that her nursing home did not tell her why she was coming here, and denies vomiting blood. Pt states she is not currently in pain.        Past Medical History  Diagnosis Date  . Arthritis   . Hypertension   . Thyroid disease   . Stroke   . Diverticulosis   . HLD (hyperlipidemia)   . Anxiety   . Depression   . GERD (gastroesophageal reflux disease)   . OP (osteoporosis)   . Hypokalemia    Past Surgical History  Procedure Laterality Date  . Abdominal hysterectomy    . Tonsillectomy    . Hip arthroplasty Left 08/28/2014    Procedure: LEFT HEMI HIP;  Surgeon: Kerin Salen, MD;  Location: [facility];  Service: Orthopedics;  Laterality: Left;   No family history on file. History  Substance Use Topics  . Smoking status: Never Smoker   . Smokeless tobacco: Not on file  . Alcohol Use: No   OB History    No data available     Review of Systems  Unable to perform ROS: Dementia  Gastrointestinal: Positive for vomiting.      Allergies  Sulfa antibiotics  Home Medications   Prior to Admission medications   Medication Sig Start Date End Date Taking? Authorizing Provider  acetaminophen (TYLENOL) 325 MG tablet Take 650 mg by mouth 3 (three) times daily. 8 am, 1 pm, & 8 pm    Historical Provider,  MD  amLODipine (NORVASC) 5 MG tablet Take 5 mg by mouth daily.    Historical Provider, MD  apixaban (ELIQUIS) 5 MG TABS tablet Take 2 tablets (10 mg total) by mouth 2 (two) times daily. For 6 days then 5 mg BID 09/02/14   Costin Karlyne Greenspan, MD  apixaban (ELIQUIS) 5 MG TABS tablet Take 1 tablet (5 mg total) by mouth 2 (two) times daily. Start after 6 days of 10 mg BID 09/08/14   Costin Karlyne Greenspan, MD  bisoprolol-hydrochlorothiazide (ZIAC) 5-6.25 MG per tablet Take 1 tablet by mouth daily.    Historical Provider, MD  Cholecalciferol (VITAMIN D3) 1000 UNITS CAPS Take 1,000 Units by mouth daily.    Historical Provider, MD  ciprofloxacin (CIPRO) 500 MG tablet Take 1 tablet (500 mg total) by mouth 2 (two) times daily. For 2 additional days 09/02/14   Caren Griffins, MD  Cranberry 425 MG CAPS Take 850 mg by mouth 2 (two) times daily.     Historical Provider, MD  FLUoxetine (PROZAC) 40 MG capsule Take 40 mg by mouth daily.    Historical Provider, MD  HYDROcodone-acetaminophen (NORCO) 5-325 MG per tablet Take 1 tablet by mouth every 6 (six) hours as needed. 08/28/14  Leighton Parody, PA-C  levothyroxine (SYNTHROID, LEVOTHROID) 112 MCG tablet Take 112 mcg by mouth daily before breakfast.    Historical Provider, MD  LORazepam (ATIVAN) 0.5 MG tablet Take 0.5 mg by mouth every 8 (eight) hours as needed. As needed for anxiety    Historical Provider, MD  mirtazapine (REMERON) 7.5 MG tablet Take 7.5 mg by mouth at bedtime.    Historical Provider, MD  omeprazole (PRILOSEC) 20 MG capsule Take 20 mg by mouth daily.    Historical Provider, MD  Probiotic Product (PROBIOTIC PO) Take 1 capsule by mouth daily.    Historical Provider, MD  solifenacin (VESICARE) 5 MG tablet Take 5 mg by mouth daily.    Historical Provider, MD  tizanidine (ZANAFLEX) 2 MG capsule Take 1 capsule (2 mg total) by mouth 3 (three) times daily. 08/28/14   Leighton Parody, PA-C  traMADol (ULTRAM) 50 MG tablet Take 50 mg by mouth every 6 (six) hours as  needed (for pain.).    Historical Provider, MD   There were no vitals taken for this visit. Physical Exam  Constitutional: She is oriented to person, place, and time. She appears well-developed and well-nourished.  Pleasantly demented female, no acute distress  HENT:  Head: Normocephalic and atraumatic.  Nose: Nose normal.  Mouth/Throat: Oropharynx is clear and moist.  Dried blood noted to face  Eyes: Conjunctivae and EOM are normal. Pupils are equal, round, and reactive to light.  Neck: Normal range of motion. Neck supple. No JVD present. No tracheal deviation present. No thyromegaly present.  Cardiovascular: Normal rate, regular rhythm, normal heart sounds and intact distal pulses.  Exam reveals no gallop and no friction rub.   No murmur heard. Pulmonary/Chest: Effort normal and breath sounds normal. No stridor. No respiratory distress. She has no wheezes. She has no rales. She exhibits no tenderness.  Abdominal: Soft. Bowel sounds are normal. She exhibits no distension and no mass. There is tenderness (mild diffuse tenderness worse in epigastrium). There is no rebound and no guarding.  Musculoskeletal: Normal range of motion. She exhibits no edema or tenderness.  Lymphadenopathy:    She has no cervical adenopathy.  Neurological: She is alert and oriented to person, place, and time. She displays normal reflexes. She exhibits normal muscle tone. Coordination normal.  Skin: Skin is warm and dry. No rash noted. No erythema. No pallor.  Psychiatric: She has a normal mood and affect. Her behavior is normal. Judgment and thought content normal.  Nursing note and vitals reviewed.   ED Course  Procedures (including critical care time)  DIAGNOSTIC STUDIES: Oxygen Saturation is 93% on RA, adequate by my interpretation.    COORDINATION OF CARE: 11:37 PM Discussed treatment plan with pt at bedside and pt agreed to plan.   Labs Review Labs Reviewed  CBC WITH DIFFERENTIAL/PLATELET -  Abnormal; Notable for the following:    Monocytes Relative 13 (*)    All other components within normal limits  PROTIME-INR - Abnormal; Notable for the following:    Prothrombin Time 17.0 (*)    All other components within normal limits  COMPREHENSIVE METABOLIC PANEL - Abnormal; Notable for the following:    Potassium 2.7 (*)    Glucose, Bld 131 (*)    GFR calc non Af Amer 72 (*)    GFR calc Af Amer 83 (*)    All other components within normal limits  POC OCCULT BLOOD, ED  SAMPLE TO BLOOD BANK    Imaging Review No results found.  EKG Interpretation   Date/Time:  Wednesday October 09 2014 00:09:18 EST Ventricular Rate:  87 PR Interval:  187 QRS Duration: 81 QT Interval:  392 QTC Calculation: 472 R Axis:   -16 Text Interpretation:  Sinus rhythm Supraventricular bigeminy Inferior  infarct, old Consider anterior infarct No significant change since last  tracing Confirmed by Deshon Hsiao  MD, Marilynn Ekstein ([id number]) on 10/09/2014 12:14:07 AM      MDM   Final diagnoses:  Hematemesis without nausea  Anticoagulant long-term use   81 year old female on Eliquis for PE with upper GI bleed.  Patient hemodynamically stable at this time.  Will start on Protonix, no prior history of esophageal varices or liver disease.  Patient will need to come off blood thinners.  Patient will need admission to the hospital.   I personally performed the services described in this documentation, which was scribed in my presence. The recorded information has been reviewed and is accurate.   Kalman Drape, MD 10/09/14 [phone]

## 2014-10-08 NOTE — ED Notes (Signed)
Arrived via EMS on stretcher. Nursing home facility vomited approximately 50cc of dark red blood. Oriented person only. Recent PE with a new blood thinner in use, Eliquis.

## 2014-10-09 DIAGNOSIS — D62 Acute posthemorrhagic anemia: Secondary | ICD-10-CM | POA: Diagnosis present

## 2014-10-09 DIAGNOSIS — I2699 Other pulmonary embolism without acute cor pulmonale: Secondary | ICD-10-CM

## 2014-10-09 DIAGNOSIS — E785 Hyperlipidemia, unspecified: Secondary | ICD-10-CM | POA: Insufficient documentation

## 2014-10-09 DIAGNOSIS — Z86711 Personal history of pulmonary embolism: Secondary | ICD-10-CM | POA: Diagnosis not present

## 2014-10-09 DIAGNOSIS — K449 Diaphragmatic hernia without obstruction or gangrene: Secondary | ICD-10-CM | POA: Diagnosis present

## 2014-10-09 DIAGNOSIS — Z66 Do not resuscitate: Secondary | ICD-10-CM | POA: Diagnosis present

## 2014-10-09 DIAGNOSIS — Z90710 Acquired absence of both cervix and uterus: Secondary | ICD-10-CM | POA: Diagnosis not present

## 2014-10-09 DIAGNOSIS — R000 Tachycardia, unspecified: Secondary | ICD-10-CM | POA: Diagnosis present

## 2014-10-09 DIAGNOSIS — K920 Hematemesis: Secondary | ICD-10-CM | POA: Diagnosis present

## 2014-10-09 DIAGNOSIS — Z882 Allergy status to sulfonamides status: Secondary | ICD-10-CM | POA: Diagnosis not present

## 2014-10-09 DIAGNOSIS — E039 Hypothyroidism, unspecified: Secondary | ICD-10-CM | POA: Diagnosis present

## 2014-10-09 DIAGNOSIS — E876 Hypokalemia: Secondary | ICD-10-CM | POA: Diagnosis present

## 2014-10-09 DIAGNOSIS — K219 Gastro-esophageal reflux disease without esophagitis: Secondary | ICD-10-CM | POA: Diagnosis present

## 2014-10-09 DIAGNOSIS — I10 Essential (primary) hypertension: Secondary | ICD-10-CM | POA: Diagnosis present

## 2014-10-09 DIAGNOSIS — G309 Alzheimer's disease, unspecified: Secondary | ICD-10-CM | POA: Diagnosis present

## 2014-10-09 DIAGNOSIS — F0280 Dementia in other diseases classified elsewhere without behavioral disturbance: Secondary | ICD-10-CM | POA: Diagnosis present

## 2014-10-09 DIAGNOSIS — Z86010 Personal history of colonic polyps: Secondary | ICD-10-CM | POA: Diagnosis not present

## 2014-10-09 DIAGNOSIS — F0390 Unspecified dementia without behavioral disturbance: Secondary | ICD-10-CM

## 2014-10-09 DIAGNOSIS — F329 Major depressive disorder, single episode, unspecified: Secondary | ICD-10-CM | POA: Diagnosis present

## 2014-10-09 DIAGNOSIS — N390 Urinary tract infection, site not specified: Secondary | ICD-10-CM | POA: Diagnosis present

## 2014-10-09 DIAGNOSIS — K210 Gastro-esophageal reflux disease with esophagitis: Secondary | ICD-10-CM

## 2014-10-09 DIAGNOSIS — Z7901 Long term (current) use of anticoagulants: Secondary | ICD-10-CM | POA: Diagnosis not present

## 2014-10-09 DIAGNOSIS — F419 Anxiety disorder, unspecified: Secondary | ICD-10-CM | POA: Diagnosis present

## 2014-10-09 DIAGNOSIS — M1990 Unspecified osteoarthritis, unspecified site: Secondary | ICD-10-CM | POA: Diagnosis present

## 2014-10-09 DIAGNOSIS — Z96649 Presence of unspecified artificial hip joint: Secondary | ICD-10-CM | POA: Diagnosis present

## 2014-10-09 DIAGNOSIS — Z8673 Personal history of transient ischemic attack (TIA), and cerebral infarction without residual deficits: Secondary | ICD-10-CM | POA: Diagnosis not present

## 2014-10-09 LAB — POC OCCULT BLOOD, ED: Fecal Occult Bld: NEGATIVE

## 2014-10-09 LAB — CBC
HCT: 33.3 % — ABNORMAL LOW (ref 36.0–46.0)
HCT: 31.2 % — ABNORMAL LOW (ref 36.0–46.0)
HCT: 32 % — ABNORMAL LOW (ref 36.0–46.0)
HEMOGLOBIN: 9.9 g/dL — AB (ref 12.0–15.0)
Hemoglobin: 10.6 g/dL — ABNORMAL LOW (ref 12.0–15.0)
Hemoglobin: 10.1 g/dL — ABNORMAL LOW (ref 12.0–15.0)
MCH: 27.2 pg (ref 26.0–34.0)
MCH: 27.1 pg (ref 26.0–34.0)
MCH: 27.4 pg (ref 26.0–34.0)
MCHC: 31.8 g/dL (ref 30.0–36.0)
MCHC: 31.7 g/dL (ref 30.0–36.0)
MCHC: 31.6 g/dL (ref 30.0–36.0)
MCV: 85.6 fL (ref 78.0–100.0)
MCV: 85.5 fL (ref 78.0–100.0)
MCV: 87 fL (ref 78.0–100.0)
PLATELETS: 331 10*3/uL (ref 150–400)
Platelets: 319 10*3/uL (ref 150–400)
Platelets: 271 10*3/uL (ref 150–400)
RBC: 3.89 MIL/uL (ref 3.87–5.11)
RBC: 3.65 MIL/uL — ABNORMAL LOW (ref 3.87–5.11)
RBC: 3.68 MIL/uL — ABNORMAL LOW (ref 3.87–5.11)
RDW: 14.8 % (ref 11.5–15.5)
RDW: 14.9 % (ref 11.5–15.5)
RDW: 15.1 % (ref 11.5–15.5)
WBC: 8.2 10*3/uL (ref 4.0–10.5)
WBC: 6.5 10*3/uL (ref 4.0–10.5)
WBC: 5.1 10*3/uL (ref 4.0–10.5)

## 2014-10-09 LAB — BASIC METABOLIC PANEL
Anion gap: 5 (ref 5–15)
BUN: 17 mg/dL (ref 6–23)
CHLORIDE: 104 mmol/L (ref 96–112)
CO2: 28 mmol/L (ref 19–32)
Calcium: 8.5 mg/dL (ref 8.4–10.5)
Creatinine, Ser: 0.65 mg/dL (ref 0.50–1.10)
GFR calc Af Amer: 89 mL/min — ABNORMAL LOW (ref >90)
GFR calc non Af Amer: 76 mL/min — ABNORMAL LOW (ref >90)
GLUCOSE: 114 mg/dL — AB (ref 70–99)
POTASSIUM: 2.9 mmol/L — AB (ref 3.5–5.1)
Sodium: 137 mmol/L (ref 135–145)

## 2014-10-09 LAB — URINALYSIS, ROUTINE W REFLEX MICROSCOPIC
BILIRUBIN URINE: NEGATIVE
Glucose, UA: NEGATIVE mg/dL
Hgb urine dipstick: NEGATIVE
KETONES UR: NEGATIVE mg/dL
NITRITE: NEGATIVE
Protein, ur: NEGATIVE mg/dL
SPECIFIC GRAVITY, URINE: 1.011 (ref 1.005–1.030)
Urobilinogen, UA: 0.2 mg/dL (ref 0.0–1.0)
pH: 8 (ref 5.0–8.0)

## 2014-10-09 LAB — COMPREHENSIVE METABOLIC PANEL
ALBUMIN: 4 g/dL (ref 3.5–5.2)
ALK PHOS: 106 U/L (ref 39–117)
ALT: 16 U/L (ref 0–35)
AST: 24 U/L (ref 0–37)
Anion gap: 9 (ref 5–15)
BUN: 17 mg/dL (ref 6–23)
CALCIUM: 9.5 mg/dL (ref 8.4–10.5)
CHLORIDE: 98 mmol/L (ref 96–112)
CO2: 31 mmol/L (ref 19–32)
CREATININE: 0.79 mg/dL (ref 0.50–1.10)
GFR calc Af Amer: 83 mL/min — ABNORMAL LOW (ref >90)
GFR calc non Af Amer: 72 mL/min — ABNORMAL LOW (ref >90)
Glucose, Bld: 131 mg/dL — ABNORMAL HIGH (ref 70–99)
Potassium: 2.7 mmol/L — CL (ref 3.5–5.1)
Sodium: 138 mmol/L (ref 135–145)
TOTAL PROTEIN: 7.3 g/dL (ref 6.0–8.3)
Total Bilirubin: 0.4 mg/dL (ref 0.3–1.2)

## 2014-10-09 LAB — GLUCOSE, CAPILLARY: GLUCOSE-CAPILLARY: 110 mg/dL — AB (ref 70–99)

## 2014-10-09 LAB — PROTIME-INR
INR: 1.37 (ref 0.00–1.49)
Prothrombin Time: 17 seconds — ABNORMAL HIGH (ref 11.6–15.2)

## 2014-10-09 LAB — APTT: APTT: 34 s (ref 24–37)

## 2014-10-09 LAB — TSH: TSH: 3.772 u[IU]/mL (ref 0.350–4.500)

## 2014-10-09 LAB — MRSA PCR SCREENING: MRSA by PCR: POSITIVE — AB

## 2014-10-09 LAB — MAGNESIUM: MAGNESIUM: 1.8 mg/dL (ref 1.5–2.5)

## 2014-10-09 LAB — URINE MICROSCOPIC-ADD ON

## 2014-10-09 MED ORDER — MUPIROCIN 2 % EX OINT
1.0000 "application " | TOPICAL_OINTMENT | Freq: Two times a day (BID) | CUTANEOUS | Status: DC
  Administered 2014-10-09 – 2014-10-12 (×7): 1 via NASAL
  Filled 2014-10-09: qty 22

## 2014-10-09 MED ORDER — FLUOXETINE HCL 40 MG PO CAPS: 40.0000 mg | ORAL_CAPSULE | Freq: Every day | ORAL | Status: DC

## 2014-10-09 MED ORDER — TIZANIDINE HCL 2 MG PO TABS
2.0000 mg | ORAL_TABLET | Freq: Three times a day (TID) | ORAL | Status: DC
  Administered 2014-10-09 – 2014-10-12 (×11): 2 mg via ORAL
  Filled 2014-10-09 (×12): qty 1

## 2014-10-09 MED ORDER — MECLIZINE HCL 12.5 MG PO TABS
12.5000 mg | ORAL_TABLET | ORAL | Status: DC | PRN
  Filled 2014-10-09: qty 1

## 2014-10-09 MED ORDER — AMLODIPINE BESYLATE 5 MG PO TABS
5.0000 mg | ORAL_TABLET | Freq: Every day | ORAL | Status: DC
  Administered 2014-10-09 – 2014-10-12 (×4): 5 mg via ORAL
  Filled 2014-10-09 (×5): qty 1

## 2014-10-09 MED ORDER — PROBIOTIC PO CAPS: 1.0000 | ORAL_CAPSULE | Freq: Every day | ORAL | Status: DC

## 2014-10-09 MED ORDER — SODIUM CHLORIDE 0.9 % IV SOLN
INTRAVENOUS | Status: DC
  Administered 2014-10-09: 04:00:00 via INTRAVENOUS

## 2014-10-09 MED ORDER — BISOPROLOL-HYDROCHLOROTHIAZIDE 5-6.25 MG PO TABS
1.0000 | ORAL_TABLET | Freq: Every day | ORAL | Status: DC
  Administered 2014-10-09 – 2014-10-12 (×4): 1 via ORAL
  Filled 2014-10-09 (×4): qty 1

## 2014-10-09 MED ORDER — CRANBERRY 425 MG PO CAPS: 850.0000 mg | ORAL_CAPSULE | Freq: Two times a day (BID) | ORAL | Status: DC

## 2014-10-09 MED ORDER — VITAMIN D3 25 MCG (1000 UNIT) PO TABS
1000.0000 [IU] | ORAL_TABLET | Freq: Every day | ORAL | Status: DC
  Administered 2014-10-09 – 2014-10-12 (×4): 1000 [IU] via ORAL
  Filled 2014-10-09 (×4): qty 1

## 2014-10-09 MED ORDER — TRAMADOL HCL 50 MG PO TABS
50.0000 mg | ORAL_TABLET | Freq: Four times a day (QID) | ORAL | Status: DC | PRN
  Administered 2014-10-12: 50 mg via ORAL
  Filled 2014-10-09: qty 1

## 2014-10-09 MED ORDER — CHLORHEXIDINE GLUCONATE CLOTH 2 % EX PADS
6.0000 | MEDICATED_PAD | Freq: Every day | CUTANEOUS | Status: DC
  Administered 2014-10-09 – 2014-10-12 (×4): 6 via TOPICAL

## 2014-10-09 MED ORDER — MORPHINE SULFATE 2 MG/ML IJ SOLN
2.0000 mg | INTRAMUSCULAR | Status: DC | PRN
  Administered 2014-10-09 – 2014-10-10 (×2): 2 mg via INTRAVENOUS
  Filled 2014-10-09: qty 1

## 2014-10-09 MED ORDER — FLUOXETINE HCL 20 MG PO CAPS
40.0000 mg | ORAL_CAPSULE | Freq: Every day | ORAL | Status: DC
  Administered 2014-10-09 – 2014-10-12 (×4): 40 mg via ORAL
  Filled 2014-10-09 (×4): qty 2

## 2014-10-09 MED ORDER — POTASSIUM CHLORIDE 10 MEQ/100ML IV SOLN
10.0000 meq | INTRAVENOUS | Status: DC
  Administered 2014-10-09: 10 meq via INTRAVENOUS
  Filled 2014-10-09 (×2): qty 100

## 2014-10-09 MED ORDER — HYDROCODONE-ACETAMINOPHEN 5-325 MG PO TABS
1.0000 | ORAL_TABLET | Freq: Four times a day (QID) | ORAL | Status: DC | PRN
  Administered 2014-10-10 – 2014-10-11 (×2): 1 via ORAL
  Filled 2014-10-09 (×2): qty 1

## 2014-10-09 MED ORDER — DARIFENACIN HYDROBROMIDE ER 7.5 MG PO TB24
7.5000 mg | ORAL_TABLET | Freq: Every day | ORAL | Status: DC
  Administered 2014-10-09 – 2014-10-12 (×4): 7.5 mg via ORAL
  Filled 2014-10-09 (×4): qty 1

## 2014-10-09 MED ORDER — MORPHINE SULFATE 2 MG/ML IJ SOLN
INTRAMUSCULAR | Status: AC
  Administered 2014-10-10: 2 mg via INTRAVENOUS
  Filled 2014-10-09: qty 1

## 2014-10-09 MED ORDER — SODIUM CHLORIDE 0.9 % IV SOLN
80.0000 mg | Freq: Once | INTRAVENOUS | Status: AC
  Administered 2014-10-09: 80 mg via INTRAVENOUS
  Filled 2014-10-09: qty 80

## 2014-10-09 MED ORDER — ACETAMINOPHEN 325 MG PO TABS
650.0000 mg | ORAL_TABLET | Freq: Three times a day (TID) | ORAL | Status: DC
  Administered 2014-10-09 – 2014-10-12 (×10): 650 mg via ORAL
  Filled 2014-10-09 (×10): qty 2

## 2014-10-09 MED ORDER — POTASSIUM CHLORIDE 20 MEQ/15ML (10%) PO SOLN
40.0000 meq | Freq: Once | ORAL | Status: AC
  Administered 2014-10-09: 40 meq via ORAL
  Filled 2014-10-09: qty 30

## 2014-10-09 MED ORDER — LORAZEPAM 0.5 MG PO TABS
0.5000 mg | ORAL_TABLET | Freq: Four times a day (QID) | ORAL | Status: DC | PRN
  Administered 2014-10-09 – 2014-10-12 (×9): 0.5 mg via ORAL
  Filled 2014-10-09 (×11): qty 1

## 2014-10-09 MED ORDER — LEVOTHYROXINE SODIUM 112 MCG PO TABS
112.0000 ug | ORAL_TABLET | Freq: Every day | ORAL | Status: DC
  Administered 2014-10-09 – 2014-10-12 (×4): 112 ug via ORAL
  Filled 2014-10-09 (×6): qty 1

## 2014-10-09 MED ORDER — RISAQUAD PO CAPS
1.0000 | ORAL_CAPSULE | Freq: Every day | ORAL | Status: DC
  Administered 2014-10-09 – 2014-10-12 (×4): 1 via ORAL
  Filled 2014-10-09 (×4): qty 1

## 2014-10-09 MED ORDER — PANTOPRAZOLE SODIUM 40 MG IV SOLR
40.0000 mg | Freq: Two times a day (BID) | INTRAVENOUS | Status: DC
  Administered 2014-10-09 – 2014-10-10 (×3): 40 mg via INTRAVENOUS
  Filled 2014-10-09 (×4): qty 40

## 2014-10-09 MED ORDER — KCL IN DEXTROSE-NACL 20-5-0.9 MEQ/L-%-% IV SOLN
INTRAVENOUS | Status: DC
  Administered 2014-10-09 – 2014-10-10 (×2): via INTRAVENOUS
  Filled 2014-10-09 (×3): qty 1000

## 2014-10-09 MED ORDER — SODIUM CHLORIDE 0.9 % IJ SOLN
3.0000 mL | Freq: Two times a day (BID) | INTRAMUSCULAR | Status: DC
  Administered 2014-10-09 – 2014-10-12 (×5): 3 mL via INTRAVENOUS

## 2014-10-09 MED ORDER — MIRTAZAPINE 7.5 MG PO TABS
7.5000 mg | ORAL_TABLET | Freq: Every day | ORAL | Status: DC
  Administered 2014-10-09 – 2014-10-11 (×3): 7.5 mg via ORAL
  Filled 2014-10-09 (×4): qty 1

## 2014-10-09 MED ORDER — SODIUM CHLORIDE 0.9 % IV SOLN
8.0000 mg/h | INTRAVENOUS | Status: DC
  Administered 2014-10-09: 8 mg/h via INTRAVENOUS
  Filled 2014-10-09 (×2): qty 80

## 2014-10-09 NOTE — Consult Note (Signed)
[facility] Consult: 11:04 AM 10/09/2014  LOS: 0 days    Referring Provider: Dr Sanjuana Letters Primary Care Physician:  Tula Nakayama Primary Gastroenterologist:  Dr. Deatra Ina     Reason for Consultation:  Hematemesis and ABL anemia.    HPI: Brianna Robbins is a 81 y.o. female.  Hx GERD, adenomatous colon polyps and sigmoid diverticulosis in 2008.  GERD on Omeprazole 20 mg at home.  Hypothyroidism.  CVA.  Dementia Hematochezia and transfusion requiring anemia due to diverticulosis in 09/2007.  CT then showed sigmoid tics with mild wall thickening is sigmoid, cecum.   11/2010 ultrasound showed uncomplicated cholelithiasis, fatty liver.  11/2010 HIDA scan was normal.   S/p 08/28/14 left hip hemi arthroplasty after fall related fracture. Hypoxia, ARF during admission.  CT angio chest 08/31/14: small bil PE, large HH, T12 compression fx. Started on Eliquis. Large HH also noted on concomitant chest x ray  Admitted 2/24 with new hematemesis, dark colored blood.  Per her niece, the patient had an episode of hematemesis 2 weeks ago and again last week. In the interim, although she was not eating well, she was not vomiting. She has not had melena, constipation, diarrhea or bloody stool.  Overnight and this morning she has not had any recurrent episodes of emesis. She denies abdominal pain, odynophagia, dysphagia. She has not had nosebleeds.  Hgb 9.7 on 1/18, 12.3 on 2/23,  9.9 today.  FOBT test 2/24 is negative.  BUN is not elevated.        Past Medical History  Diagnosis Date  . Arthritis   . Hypertension   . Thyroid disease   . Stroke   . Diverticulosis   . HLD (hyperlipidemia)   . Anxiety   . Depression   . GERD (gastroesophageal reflux disease)   . OP (osteoporosis)   . Hypokalemia     Past Surgical History   Procedure Laterality Date  . Abdominal hysterectomy    . Tonsillectomy    . Hip arthroplasty Left 08/28/2014    Procedure: LEFT HEMI HIP;  Surgeon: Kerin Salen, MD;  Location: [facility];  Service: Orthopedics;  Laterality: Left;    Prior to Admission medications   Medication Sig Start Date End Date Taking? Authorizing Provider  acetaminophen (TYLENOL) 325 MG tablet Take 650 mg by mouth 3 (three) times daily. 8 am, 1 pm, & 8 pm   Yes Historical Provider, MD  amLODipine (NORVASC) 5 MG tablet Take 5 mg by mouth daily.   Yes Historical Provider, MD  apixaban (ELIQUIS) 5 MG TABS tablet Take 1 tablet (5 mg total) by mouth 2 (two) times daily. Start after 6 days of 10 mg BID 09/08/14  Yes Costin Karlyne Greenspan, MD  Cholecalciferol (VITAMIN D3) 1000 UNITS CAPS Take 1,000 Units by mouth daily.   Yes Historical Provider, MD  Cranberry 475 MG CAPS Take 475 mg by mouth 2 (two) times daily.   Yes Historical Provider, MD  FLUoxetine (PROZAC) 40 MG capsule Take 40 mg by mouth daily.   Yes Historical Provider, MD  HYDROcodone-acetaminophen [facility])  5-325 MG per tablet Take 1 tablet by mouth every 6 (six) hours as needed. Patient taking differently: Take 1 tablet by mouth every 6 (six) hours as needed for moderate pain.  08/28/14  Yes Leighton Parody, PA-C  levothyroxine (SYNTHROID, LEVOTHROID) 125 MCG tablet Take 125 mcg by mouth daily before breakfast.   Yes Historical Provider, MD  LORazepam (ATIVAN) 0.5 MG tablet Take 0.5 mg by mouth every 6 (six) hours as needed for anxiety. As needed for anxiety    Yes Historical Provider, MD  meclizine (ANTIVERT) 12.5 MG tablet Take 12.5 mg by mouth every 4 (four) hours as needed for dizziness or nausea.   Yes Historical Provider, MD  mirtazapine (REMERON) 7.5 MG tablet Take 7.5 mg by mouth at bedtime.   Yes Historical Provider, MD  Probiotic Product (PROBIOTIC PO) Take 1 capsule by mouth daily.   Yes Historical Provider, MD  ranitidine (ZANTAC) 300 MG tablet Take 300 mg by  mouth every morning.   Yes Historical Provider, MD  solifenacin (VESICARE) 5 MG tablet Take 5 mg by mouth daily.   Yes Historical Provider, MD  traMADol (ULTRAM) 50 MG tablet Take 50 mg by mouth every 6 (six) hours as needed for moderate pain.    Yes Historical Provider, MD  apixaban (ELIQUIS) 5 MG TABS tablet Take 2 tablets (10 mg total) by mouth 2 (two) times daily. For 6 days then 5 mg BID Patient not taking: Reported on 10/09/2014 09/02/14   Caren Griffins, MD  bisoprolol-hydrochlorothiazide [facility]) 5-6.25 MG per tablet Take 1 tablet by mouth daily.    Historical Provider, MD  ciprofloxacin (CIPRO) 500 MG tablet Take 1 tablet (500 mg total) by mouth 2 (two) times daily. For 2 additional days Patient not taking: Reported on 10/09/2014 09/02/14   Caren Griffins, MD  Cranberry 425 MG CAPS Take 850 mg by mouth 2 (two) times daily.     Historical Provider, MD  levothyroxine (SYNTHROID, LEVOTHROID) 112 MCG tablet Take 112 mcg by mouth daily before breakfast.    Historical Provider, MD  omeprazole (PRILOSEC) 20 MG capsule Take 20 mg by mouth daily.    Historical Provider, MD  tizanidine (ZANAFLEX) 2 MG capsule Take 1 capsule (2 mg total) by mouth 3 (three) times daily. Patient not taking: Reported on 10/09/2014 08/28/14   Leighton Parody, PA-C    Scheduled Meds: . acetaminophen  650 mg Oral TID  . acidophilus  1 capsule Oral Daily  . amLODipine  5 mg Oral Daily  . bisoprolol-hydrochlorothiazide  1 tablet Oral Daily  . Chlorhexidine Gluconate Cloth  6 each Topical Q0600  . cholecalciferol  1,000 Units Oral Daily  . darifenacin  7.5 mg Oral Daily  . FLUoxetine  40 mg Oral Daily  . levothyroxine  112 mcg Oral QAC breakfast  . mirtazapine  7.5 mg Oral QHS  . mupirocin ointment  1 application Nasal BID  . pantoprazole (PROTONIX) IV  40 mg Intravenous Q12H  . sodium chloride  3 mL Intravenous Q12H  . tiZANidine  2 mg Oral TID   Infusions: . dextrose 5 % and 0.9 % NaCl with KCl 20 mEq/L     PRN  Meds: HYDROcodone-acetaminophen, LORazepam, meclizine, morphine injection, traMADol   Allergies as of 10/08/2014 - Review Complete 10/08/2014  Allergen Reaction Noted  . Sulfa antibiotics Rash 10/16/2011    No family history on file.  History   Social History  . Marital Status: Married    Spouse Name: N/A  .  Number of Children: N/A  . Years of Education: N/A   Occupational History  . Not on file.   Social History Main Topics  . Smoking status: Never Smoker   . Smokeless tobacco: Not on file  . Alcohol Use: No  . Drug Use: No  . Sexual Activity: Not on file   Other Topics Concern  . Not on file   Social History Narrative    REVIEW OF SYSTEMS: Constitutional:  Patient's family stated the patient has felt depressed since her hip fracture and transition from assisted living, where she could play bingo and engage in social activities, into skilled nursing.   ENT:  No nose bleeds Pulm:  No dyspnea, no cough. CV:  No palpitations, no LE edema. No chest pain GU:  No hematuria, no frequency GI:  Per HPI  Heme:  Per HPI   Transfusions:  Per HPI Neuro:  No headaches, no peripheral tingling or numbness Derm:  No itching, no rash or sores.  Endocrine:  No sweats or chills.  No polyuria or dysuria Immunization:  Family believes that her flu and pneumococcal vaccinations are up-to-date. Travel:  None beyond local counties in last few months.    PHYSICAL EXAM: Vital signs in last 24 hours: Filed Vitals:   10/09/14 1015  BP: 143/58  Pulse: 79  Temp: 98.2 F (36.8 C)  Resp: 18   Wt Readings from Last 3 Encounters:  08/28/14 166 lb 0.1 oz (75.3 kg)  10/16/11 175 lb (79.379 kg)   General: Tired but easily aroused, pleasant, comfortable elderly WF. She does not appear acutely ill. No pallor. Head:  No facial asymmetry or edema. No signs of head trauma.  Eyes:  No scleral icterus, no conjunctival pallor. PERRLA. Ears:  No obvious hearing deficits.  Nose:  No congestion  or nasal discharge Mouth:  Mucous membranes are dry. No blood or lesions in the oral mucosa. Neck:  No TMG, no JVD, no masses. Lungs:  non labored breathing. No cough. Lungs are clear to auscultation bilaterally. Heart: RRR. No MRG. S1-S2 audible. Abdomen:  Soft, NT, ND.  No bruits, masses or HSM. Bowel sounds normoactive..   Rectal: Rectal vault is smooth with good tone. There is no stool palpable or on the exam glove. No blood on exam glove.   Musc/Skeltl: No joint swelling, contractures or redness. Well intact and healing erythematous scar consistent with hip surgery on the left. Extremities:  No pedal edema.  Neurologic:  Patient is alert and asks appropriate questions. She is not oriented to year. However she knows she is in the hospital and her name. She recognizes family and acknowledges my presence.  No tremor. No gross neurologic deficits. Skin:  No telangiectasia, no sores, no rash. Tattoos:  None Nodes:  No cervical or inguinal adenopathy.   Psych:  Pleasant, calm and relaxed.  Intake/Output from previous day: 02/23 0701 - 02/24 0700 In: -  Out: 3 [Urine:3] Intake/Output this shift: Total I/O In: -  Out: 1 [Urine:1]  LAB RESULTS:  Recent Labs  10/08/14 2336 10/09/14 0407 10/09/14 0852  WBC 7.7 8.2 6.5  HGB 12.3 10.6* 9.9*  HCT 38.2 33.3* 31.2*  PLT 367 331 319   BMET Lab Results  Component Value Date   NA 137 10/09/2014   NA 138 10/08/2014   NA 137 09/01/2014   K 2.9* 10/09/2014   K 2.7* 10/08/2014   K 3.3* 09/02/2014   CL 104 10/09/2014   CL 98 10/08/2014   CL  97 09/01/2014   CO2 28 10/09/2014   CO2 31 10/08/2014   CO2 31 09/01/2014   GLUCOSE 114* 10/09/2014   GLUCOSE 131* 10/08/2014   GLUCOSE 115* 09/01/2014   BUN 17 10/09/2014   BUN 17 10/08/2014   BUN 22 09/01/2014   CREATININE 0.65 10/09/2014   CREATININE 0.79 10/08/2014   CREATININE 0.74 09/01/2014   CALCIUM 8.5 10/09/2014   CALCIUM 9.5 10/08/2014   CALCIUM 8.6 09/01/2014    LFT  Recent Labs  10/08/14 2336  PROT 7.3  ALBUMIN 4.0  AST 24  ALT 16  ALKPHOS 106  BILITOT 0.4   PT/INR Lab Results  Component Value Date   INR 1.37 10/08/2014   INR 1.0 10/02/2007   Lipase     Component Value Date/Time   LIPASE 51 08/12/2008 2132    RADIOLOGY STUDIES: No results found.  ENDOSCOPIC STUDIES: 06/2007  Colonoscopy.  Dr Deatra Ina.  Average risk screening 2 cecal polyps (adenomatous without HGD).  Diffuse sigmoid diverticulosis.    IMPRESSION:   *  Hematemesis.  Rule out ulcer, r/o Cameron's lesion in pt with large HH.  R/o AVM.    *  Small bil PE 08/31/14.  Treated with Eliquis, now on hold.   *  ABL anemia.  *  Hypokalemia.   *  S/p 08/28/14 left hip fracture and hemi arthroplasty.   *  Adenomatous colon polyps and sigmoid tics on colonoscopy 2008.    *  Alzheimer's dementia, by my observation today this is not an advanced case of dementia.    PLAN:     *  EGD arranged for 10/10/14 at 0800.  Continue twice a day IV Protonix. Okay to have sips of clears until she is again nothing by mouth after midnight tonight.  *  Given her DO NOT RESUSCITATE status, question as to whether she needs to be continued on telemetry. Also the nieces relay patient and family wishes to not to pursue unnecessary invasive procedures. They are okay with and consenting to the upper endoscopy but has for surgery, angiograms and such they may decide to decline such interventions in an elderly patient with dementia and "poor quality of life" (their words).   Azucena Freed  10/09/2014, 11:04 AM Pager: [phone]      Attending physician's note   I have taken a history, examined the patient and reviewed the chart. I agree with the Advanced Practitioner's note, impression and recommendations. Hematemesis in setting of Eliquis and recent PEs. ABL anemia. Pt has advanced dementia and is DNR. Hold Eliquis. IV PPI. Monitor Hb/Hct. EGD tomorrow to allow Eliquis to clear.    Ladene Artist, MD [facility]

## 2014-10-09 NOTE — Progress Notes (Signed)
PT Cancellation Note  Patient Details Name: Brianna Robbins MRN: [id number] DOB: 18-Mar-1923   Cancelled Treatment:    Reason Eval/Treat Not Completed: Patient not medically ready. Pt currently with hypokalemia (2.9 mmol/L) and on strict bed rest. Will continue to check back as schedule allows to complete PT eval.    Rolinda Roan 10/09/2014, 7:40 AM   Rolinda Roan, PT, DPT Acute Rehabilitation Services Pager: [phone]

## 2014-10-09 NOTE — Clinical Documentation Improvement (Signed)
Pt admitted with hematemesis.  Please identify any clinical conditions associated with the abnormal labs below, if any, and document in your progress note and carry over to the discharge summary.    Component      RBC Hemoglobin HCT  Latest Ref Rng      3.87 - 5.11 MIL/uL 12.0 - 15.0 g/dL 36.0 - 46.0 %  10/08/2014      4.45 12.3 38.2  10/09/2014     4:07 AM 3.89 10.6 (L) 33.3 (L)  10/09/2014     8:52 AM 3.65 (L) 9.9 (L) 31.2 (L)   Possible Clinical Conditions: -anemia (if present, please document acuity and type) -other condition -unable to determine -not clinically significant  Thank you, Mateo Flow, RN [phone] Clinical Documentation Specialist

## 2014-10-09 NOTE — Progress Notes (Signed)
Utilization review completed. Fadia Marlar, RN, BSN. 

## 2014-10-09 NOTE — Progress Notes (Signed)
VASCULAR LAB PRELIMINARY  PRELIMINARY  PRELIMINARY  PRELIMINARY  Bilateral lower extremity venous duplex  completed.    Preliminary report:  Bilateral:  No evidence of DVT, superficial thrombosis, or Baker's Cyst.    Siyah Mault, RVT 10/09/2014, 4:11 PM

## 2014-10-09 NOTE — Clinical Social Work Psychosocial (Signed)
Clinical Social Work Department BRIEF PSYCHOSOCIAL ASSESSMENT 10/09/2014  Patient:  Brianna Robbins, Brianna Robbins     Account Number:  [id number]     Admit date:  10/08/2014  Clinical Social Worker:  Frederico Hamman  Date/Time:  10/09/2014 03:41 AM  Referred by:  CSW  Date Referred:  10/09/2014 Referred for  SNF Placement   Other Referral:   Interview type:  Family Other interview type:    PSYCHOSOCIAL DATA Living Status:  FACILITY Admitted from facility:  [facility] Level of care:  [place] Primary support name:  Sister - Howell Pringle Primary support relationship to patient:  [place] Degree of support available:   Good support. Patient's sister [phone] in [place] and her daughter, Secundino Ginger lives out of state.    CURRENT CONCERNS Current Concerns  Post-Acute Placement   Other Concerns:    SOCIAL WORK ASSESSMENT / PLAN CSW contacted sister by phone as patient is only oriented to self. Sister confirmed that patient from [facility] and has been there for some time. Per Ms. Dull, patient was initially in assisted living, then after falling moved to skilled for rehab. Per sister, patient will return to rehab facility at [facility].   Assessment/plan status:  Psychosocial Support/Ongoing Assessment of Needs Other assessment/ plan:   Information/referral to community resources:   None needed or requested at this tiem.    PATIENT'S/FAMILY'S RESPONSE TO PLAN OF CARE: Sister receptive to talking with CSW about discharge plan and confirmed that patient will return to [facility] when ready for discharge.      Ziyon Soltau Givens, MSW, LCSW Licensed Clinical Social Worker [place] [phone]

## 2014-10-09 NOTE — H&P (Signed)
[facility] History and Physical  Brianna Robbins OXB:[id number] DOB: 1923-06-22 DOA: 10/08/2014  Referring physician: ED physician PCP: Tula Nakayama  Specialists:   Chief Complaint: Hematemesis  HPI: Brianna Robbins is a 81 y.o. female his past medical history of hypertension, GERD, hyperlipidemia, stroke, hypothyroidism, small pulmonary embolism on Eliquis, who presents with hematemesis.  Patient has dementia, history is obtained from her sister and EMS and ED report. Patient was noticed by SNF staff to have hematemesis after dinner. She vomited dark-colored blood. It not very sure how much blood she vomited, but approximately 50 mL per Dr. Sharol Given in emergency room. Patient had developed small pulmonary embolism on 08/31/14 after she had left femur fracture repair. She is currently taking Eliquis. She is asymptomatic. She does not have chest pain, shortness of breath, abdominal pain. Patient denies fever, chills, headaches, cough, chest pain, SOB, abdominal pain, diarrhea, constipation, dysuria, urgency, frequency, hematuria, skin rashes or leg swelling. No unilateral weakness, numbness or tingling sensations. No vision change or hearing loss. Of note, her sister reports that she had a similar episode last week. She vomited small amount of blood last week, but no detailed information available.  In ED, patient was found to have hemoglobin 12.3, hypokalemia with potassium 2.7, active appendectomy O BP, tachycardia with a heart rate 111. Patient is admitted to inpatient for further evaluation and treatment.  Review of Systems: As presented in the history of presenting illness, rest negative.  Where does patient live?  SNF Can patient participate in ADLs? none  Allergy:  Allergies  Allergen Reactions  . Sulfa Antibiotics Rash    Past Medical History  Diagnosis Date  . Arthritis   . Hypertension   . Thyroid disease   . Stroke   . Diverticulosis   . HLD (hyperlipidemia)   .  Anxiety   . Depression   . GERD (gastroesophageal reflux disease)   . OP (osteoporosis)   . Hypokalemia     Past Surgical History  Procedure Laterality Date  . Abdominal hysterectomy    . Tonsillectomy    . Hip arthroplasty Left 08/28/2014    Procedure: LEFT HEMI HIP;  Surgeon: Kerin Salen, MD;  Location: [place];  Service: Orthopedics;  Laterality: Left;    Social History:  reports that she has never smoked. She does not have any smokeless tobacco history on file. She reports that she does not drink alcohol or use illicit drugs.  Family History: Can not obtained due to dementia  Prior to Admission medications   Medication Sig Start Date End Date Taking? Authorizing Provider  acetaminophen (TYLENOL) 325 MG tablet Take 650 mg by mouth 3 (three) times daily. 8 am, 1 pm, & 8 pm    Historical Provider, MD  amLODipine (NORVASC) 5 MG tablet Take 5 mg by mouth daily.    Historical Provider, MD  apixaban (ELIQUIS) 5 MG TABS tablet Take 2 tablets (10 mg total) by mouth 2 (two) times daily. For 6 days then 5 mg BID 09/02/14   Costin Karlyne Greenspan, MD  apixaban (ELIQUIS) 5 MG TABS tablet Take 1 tablet (5 mg total) by mouth 2 (two) times daily. Start after 6 days of 10 mg BID 09/08/14   Costin Karlyne Greenspan, MD  bisoprolol-hydrochlorothiazide (ZIAC) 5-6.25 MG per tablet Take 1 tablet by mouth daily.    Historical Provider, MD  Cholecalciferol (VITAMIN D3) 1000 UNITS CAPS Take 1,000 Units by mouth daily.    Historical Provider, MD  ciprofloxacin (CIPRO) 500 MG  tablet Take 1 tablet (500 mg total) by mouth 2 (two) times daily. For 2 additional days 09/02/14   Caren Griffins, MD  Cranberry 425 MG CAPS Take 850 mg by mouth 2 (two) times daily.     Historical Provider, MD  FLUoxetine (PROZAC) 40 MG capsule Take 40 mg by mouth daily.    Historical Provider, MD  HYDROcodone-acetaminophen (NORCO) 5-325 MG per tablet Take 1 tablet by mouth every 6 (six) hours as needed. 08/28/14   Leighton Parody, PA-C  levothyroxine  (SYNTHROID, LEVOTHROID) 112 MCG tablet Take 112 mcg by mouth daily before breakfast.    Historical Provider, MD  LORazepam (ATIVAN) 0.5 MG tablet Take 0.5 mg by mouth every 8 (eight) hours as needed. As needed for anxiety    Historical Provider, MD  mirtazapine (REMERON) 7.5 MG tablet Take 7.5 mg by mouth at bedtime.    Historical Provider, MD  omeprazole (PRILOSEC) 20 MG capsule Take 20 mg by mouth daily.    Historical Provider, MD  Probiotic Product (PROBIOTIC PO) Take 1 capsule by mouth daily.    Historical Provider, MD  solifenacin (VESICARE) 5 MG tablet Take 5 mg by mouth daily.    Historical Provider, MD  tizanidine (ZANAFLEX) 2 MG capsule Take 1 capsule (2 mg total) by mouth 3 (three) times daily. 08/28/14   Leighton Parody, PA-C  traMADol (ULTRAM) 50 MG tablet Take 50 mg by mouth every 6 (six) hours as needed (for pain.).    Historical Provider, MD    Physical Exam: Filed Vitals:   10/09/14 0130 10/09/14 0145 10/09/14 0200 10/09/14 0215  BP:  157/68 159/75 127/74  Pulse: 88 99 93 96  Temp:      TempSrc:      Resp: 17 14 19 19   SpO2: 95% 96% 95% 95%   General: Not in acute distress HEENT:       Eyes: PERRL, EOMI, no scleral icterus       ENT: No discharge from the ears and nose, no pharynx injection, no tonsillar enlargement.        Neck: No JVD, no bruit, no mass felt. Cardiac: S1/S2, irregular rhythm, No murmurs, No gallops or rubs Pulm: Good air movement bilaterally. Clear to auscultation bilaterally. No rales, wheezing, rhonchi or rubs. Abd: Soft, nondistended, mild tenderness over epigastric area, no rebound pain, no organomegaly, BS present Ext: No edema bilaterally. 2+DP/PT pulse bilaterally Musculoskeletal: No joint deformities, erythema, or stiffness, ROM full Skin: No rashes.  Neuro: Alert and oriented X3, cranial nerves II-XII grossly intact, muscle strength 5/5 in all extremeties, sensation to light touch intact. Brachial reflex 2+ bilaterally. Knee reflex 1+  bilaterally. Psych: Patient is not psychotic, no suicidal or hemocidal ideation.  Labs on Admission:  Basic Metabolic Panel:  Recent Labs Lab 10/08/14 2336  NA 138  K 2.7*  CL 98  CO2 31  GLUCOSE 131*  BUN 17  CREATININE 0.79  CALCIUM 9.5   Liver Function Tests:  Recent Labs Lab 10/08/14 2336  AST 24  ALT 16  ALKPHOS 106  BILITOT 0.4  PROT 7.3  ALBUMIN 4.0   No results for input(s): LIPASE, AMYLASE in the last 168 hours. No results for input(s): AMMONIA in the last 168 hours. CBC:  Recent Labs Lab 10/08/14 2336  WBC 7.7  NEUTROABS 5.3  HGB 12.3  HCT 38.2  MCV 85.8  PLT 367   Cardiac Enzymes: No results for input(s): CKTOTAL, CKMB, CKMBINDEX, TROPONINI in the last 168 hours.  BNP (last 3 results)  Recent Labs  08/30/14 0518  BNP 40.5    ProBNP (last 3 results) No results for input(s): PROBNP in the last 8760 hours.  CBG: No results for input(s): GLUCAP in the last 168 hours.  Radiological Exams on Admission: No results found.  EKG: Independently reviewed. 1st AV block, sinus arrhythmia. This is not new, and existed on previous EKG on 08/30/14.  Assessment/Plan Principal Problem:   Hematemesis Active Problems:   Essential hypertension   Hypokalemia   Dementia   Hypothyroidism   GERD (gastroesophageal reflux disease)   PE (pulmonary thromboembolism)  Hematemesis: It is most likely due to Elliquis use.  Patient may have underlying gastric disease. I could not find any previous EGD record. Currently hemoglobin is stable. Hemodynamically stable. -We'll hold Eliquis -IVF: ns 100cc/h -cbc q6h -protonix 40 mg bid -check INR/PTT  Hx of PE; patient had CT angiogram on 08/31/14, which showed small bilateral pulmonary embolism. Currently patient does not have shortness breath or chest pain. Oxygen saturation is normal. -will hold Eliquis  -observe closely for any signs of worsening respiratory function. -get LE  doppler  Hypertension: -Continue home medications  Hypothyroidism: TSH was 17.115 on 05/01/08. Patient is on Synthroid at home. -Continue Synthroid -Check TSH  Hypokalemia: Potassium 2.7 -repleted -Check magnesium level  GERD: -Protonix    DVT ppx: none. Patient has hematemesis, therefore cannot continue anticoagulant. Patient has history of PE, but no Doppler performed, therefore not sure whether patient has lower extremity DVT. It is too risky to start SCD, which will put patient at risk of getting another PE if patient has DVT in legs.  Code Status: DNR Family Communication:   Yes, patient's  sister     at bed side Disposition Plan: Admit to inpatient   Date of Service 10/09/2014    Ivor Costa [facility] Pager [phone]  If 7PM-7AM, please contact night-coverage [email or web address] Password [facility] 10/09/2014, 2:32 AM

## 2014-10-09 NOTE — Progress Notes (Signed)
Admission note:  Arrival Method: Pt arrived on stretcher from ED Mental Orientation: Alert and oriented to self only Telemetry: Telemetry applied, CCMD notified. Pt running normal sinus Assessment: Completed, see doc flowsheets Skin: Bruising noted to right elbow, gauze in place. Unstageable pressure wound to left heel, measures 2cm x 2cm, clean and dry, foam dressing applied.  IV: Right AC with normal saline infusing at 159ml/hr. Clean dry and intact Pain: Pt lying comfortably in bed with no pain stated at this time. Tubes: IV tubing secured Safety Measures: High fall risk. Non-slip socks placed, bed alarm on, family member in room. Fall Prevention Safety Plan: Reviewed with pt and family Admission Screening: Completed 6700 Orientation: Patient has been oriented to the unit, staff and to the room. Pt lying comfortably in bed with no needs stated at this time. Pt's sister is present in the room. Call light within reach, will continue to monitor.   Shelbie Hutching, RN

## 2014-10-09 NOTE — Progress Notes (Signed)
I have seen and examined [name] at bedside in the presence of her sister and granddaughter and reviewed her chart. Brianna Robbins is a pleasant 81 y.o. female with essential hypertension, GERD, hyperlipidemia, history of CVA, hypothyroidism, small pulmonary embolism on Eliquis(diagnosed last month after a fall resulting in femoral fracture), dementia who presented with hematemesis resulting in acute blood loss anemia-hemoglobin dropped from 12.3-9.9 g/dl since admission. Her family states that she has been generally weak since discharge last month. She is hypokalemic, likely from GI loss and poor oral intake. Will continue to monitor hemoglobin and transfuse PRBC if hemoglobin drops below 8 g/dl. Aree with venous Doppler; family open to IVC filter if necessary. Will consult GI, ? Source of hematemesis. Meanwhile, will continue PPI and continue to hold Eliquis. Will check UA/UC. Please refer to Dr. Edgar Frisk comprehensive H&P and care plan for details.

## 2014-10-10 ENCOUNTER — Encounter (HOSPITAL_COMMUNITY): Payer: Self-pay | Admitting: *Deleted

## 2014-10-10 ENCOUNTER — Inpatient Hospital Stay (HOSPITAL_COMMUNITY): Payer: Medicare Other

## 2014-10-10 ENCOUNTER — Encounter (HOSPITAL_COMMUNITY)
Admission: EM | Disposition: A | Discharge status: Skilled Nursing Facility | Payer: Self-pay | Source: Home / Self Care | Attending: Internal Medicine

## 2014-10-10 DIAGNOSIS — K208 Other esophagitis: Secondary | ICD-10-CM

## 2014-10-10 DIAGNOSIS — K2210 Ulcer of esophagus without bleeding: Secondary | ICD-10-CM | POA: Diagnosis present

## 2014-10-10 DIAGNOSIS — N390 Urinary tract infection, site not specified: Secondary | ICD-10-CM | POA: Diagnosis present

## 2014-10-10 HISTORY — PX: ESOPHAGOGASTRODUODENOSCOPY: SHX5428

## 2014-10-10 LAB — COMPREHENSIVE METABOLIC PANEL
ALT: 12 U/L (ref 0–35)
AST: 18 U/L (ref 0–37)
Albumin: 2.7 g/dL — ABNORMAL LOW (ref 3.5–5.2)
Alkaline Phosphatase: 71 U/L (ref 39–117)
Anion gap: 9 (ref 5–15)
BUN: 12 mg/dL (ref 6–23)
CHLORIDE: 108 mmol/L (ref 96–112)
CO2: 22 mmol/L (ref 19–32)
CREATININE: 0.64 mg/dL (ref 0.50–1.10)
Calcium: 8.4 mg/dL (ref 8.4–10.5)
GFR, EST AFRICAN AMERICAN: 89 mL/min — AB (ref >90)
GFR, EST NON AFRICAN AMERICAN: 77 mL/min — AB (ref >90)
GLUCOSE: 105 mg/dL — AB (ref 70–99)
Potassium: 3.3 mmol/L — ABNORMAL LOW (ref 3.5–5.1)
Sodium: 139 mmol/L (ref 135–145)
Total Bilirubin: 0.4 mg/dL (ref 0.3–1.2)
Total Protein: 5.2 g/dL — ABNORMAL LOW (ref 6.0–8.3)

## 2014-10-10 LAB — CBC
HCT: 33.4 % — ABNORMAL LOW (ref 36.0–46.0)
Hemoglobin: 10.5 g/dL — ABNORMAL LOW (ref 12.0–15.0)
MCH: 27.9 pg (ref 26.0–34.0)
MCHC: 31.4 g/dL (ref 30.0–36.0)
MCV: 88.6 fL (ref 78.0–100.0)
Platelets: 295 10*3/uL (ref 150–400)
RBC: 3.77 MIL/uL — ABNORMAL LOW (ref 3.87–5.11)
RDW: 15.1 % (ref 11.5–15.5)
WBC: 5.3 10*3/uL (ref 4.0–10.5)

## 2014-10-10 LAB — URINE CULTURE: Colony Count: >=100000

## 2014-10-10 LAB — PROTIME-INR
INR: 1.37 (ref 0.00–1.49)
Prothrombin Time: 17 seconds — ABNORMAL HIGH (ref 11.6–15.2)

## 2014-10-10 LAB — GLUCOSE, CAPILLARY
Glucose-Capillary: 113 mg/dL — ABNORMAL HIGH (ref 70–99)
Glucose-Capillary: 111 mg/dL — ABNORMAL HIGH (ref 70–99)

## 2014-10-10 SURGERY — EGD (ESOPHAGOGASTRODUODENOSCOPY)
Anesthesia: Moderate Sedation

## 2014-10-10 MED ORDER — LEVOFLOXACIN IN D5W 500 MG/100ML IV SOLN
500.0000 mg | INTRAVENOUS | Status: DC
  Administered 2014-10-10: 500 mg via INTRAVENOUS
  Filled 2014-10-10 (×2): qty 100

## 2014-10-10 MED ORDER — MIDAZOLAM HCL 10 MG/2ML IJ SOLN
INTRAMUSCULAR | Status: DC | PRN
  Administered 2014-10-10 (×2): 1 mg via INTRAVENOUS

## 2014-10-10 MED ORDER — FENTANYL CITRATE 0.05 MG/ML IJ SOLN
INTRAMUSCULAR | Status: DC | PRN
  Administered 2014-10-10 (×2): 12.5 ug via INTRAVENOUS

## 2014-10-10 MED ORDER — POTASSIUM CHLORIDE 20 MEQ/15ML (10%) PO SOLN
40.0000 meq | Freq: Once | ORAL | Status: AC
  Administered 2014-10-10: 40 meq via ORAL
  Filled 2014-10-10 (×2): qty 30

## 2014-10-10 MED ORDER — BUTAMBEN-TETRACAINE-BENZOCAINE 2-2-14 % EX AERO
INHALATION_SPRAY | CUTANEOUS | Status: DC | PRN
  Administered 2014-10-10: 2 via TOPICAL

## 2014-10-10 MED ORDER — SODIUM CHLORIDE 0.9 % IV SOLN
INTRAVENOUS | Status: DC
  Administered 2014-10-11: 12:00:00 via INTRAVENOUS

## 2014-10-10 MED ORDER — FENTANYL CITRATE 0.05 MG/ML IJ SOLN
INTRAMUSCULAR | Status: AC
  Filled 2014-10-10: qty 2

## 2014-10-10 MED ORDER — PANTOPRAZOLE SODIUM 40 MG PO TBEC
40.0000 mg | DELAYED_RELEASE_TABLET | Freq: Two times a day (BID) | ORAL | Status: DC
  Administered 2014-10-10 – 2014-10-12 (×4): 40 mg via ORAL
  Filled 2014-10-10 (×4): qty 1

## 2014-10-10 MED ORDER — MIDAZOLAM HCL 5 MG/ML IJ SOLN
INTRAMUSCULAR | Status: AC
  Filled 2014-10-10: qty 1

## 2014-10-10 NOTE — Progress Notes (Signed)
[facility] PROGRESS NOTE  Brianna Robbins HKV:[id number] DOB: 03/15/1923 DOA: 10/08/2014 PCP: Tula Nakayama  Summary I have seen and examined [name] at bedside in the presence of her granddaughter and reviewed her chart. Appreciate GI. CATORI PANOZZO is a pleasant 81 y.o. female with essential hypertension, GERD, hyperlipidemia, history of CVA, hypothyroidism, small pulmonary embolism on Eliquis(diagnosed last month after a fall resulting in femoral fracture), dementia who presented with hematemesis resulting in acute blood loss anemia-hemoglobin dropped from 12.3-9.9 g/dl after admission. She had EGD which showed "1. LA Class C esophagitis 2. 6 cm hiatal hernia". She also has UTI and is hypokalemic, likely from GI loss and poor oral intake. The UTI may have triggered vomiting. Her family states that she has been generally weak since discharge last month. Noted GI recommendations regarding anticoagulation; if anticoagulation resumed before esophagitis resolves, patient likely to bleed again hence would need close monitoring of hemoglobin. Family does not want IVC filter. Patient's granddaughter will discuss with the rest of family members regarding the available options going forward, which include a)resuming anticoagulation, and b)no anticoagulation but no IVC filter. Meanwhile, patient will continue PPI as recommended by GI-twice a day dosing for 2 weeks then daily dosing for life. Will add Levaquin and obtain chest x-ray as family also stated that patient has been coughing. Since then as Doppler does not show DVT, will start SCDs. Hopefully, patient can discharge back to SNF in the next 24-48 hours if no more bleeding and patient back to her baseline. She reportedly has been confused today, this is likely due to urinary tract infection. Plan Hematemesis/GERD (gastroesophageal reflux disease)/Acute blood loss anemia/Erosive esophagitis/Hypokalemia  Continue PPI  Replenish potassium PE  (pulmonary thromboembolism)  No anticoagulation for now. Family offered IVC filter but declined and they realize patient could die from another acute PE. Family will decide regarding when to resume anticoagulation.  SCDs for DVT prophylaxis UTI (urinary tract infection)  Follow urine culture  Levaquin  Chest x-ray Essential hypertension/Dementia/Hypothyroidism  No acute changes  Continue current management Code Status: DNR/DNI Family Communication: [employer] daughter at bedside Disposition Plan: Back to SNF   Consultants:  GI  Procedures:  EGD to 2516  Antibiotics:  Levaquin 10/10/2014>  HPI/Subjective: No complaints.  Objective: Filed Vitals:   10/10/14 0911  BP: 101/46  Pulse: 63  Temp: 98.4 F (36.9 C)  Resp: 17    Intake/Output Summary (Last 24 hours) at 10/10/14 1510 Last data filed at 10/10/14 1429  Gross per 24 hour  Intake    570 ml  Output      0 ml  Net    570 ml   Filed Weights   10/09/14 2131  Weight: 103.828 kg (228 lb 14.4 oz)    Exam:   General:  Comfortable at rest.  Cardiovascular: S1-S2 normal. No murmurs. Pulse regular.  Respiratory: Good air entry bilaterally. No rhonchi or rales.  Abdomen: Soft and nontender. Normal bowel sounds. No organomegaly.  Musculoskeletal: No pedal edema   Neurological: Intact  Data Reviewed: Basic Metabolic Panel:  Recent Labs Lab 10/08/14 2336 10/09/14 0407 10/10/14 0536  NA 138 137 139  K 2.7* 2.9* 3.3*  CL 98 104 108  CO2 31 28 22   GLUCOSE 131* 114* 105*  BUN 17 17 12   CREATININE 0.79 0.65 0.64  CALCIUM 9.5 8.5 8.4  MG  --  1.8  --    Liver Function Tests:  Recent Labs Lab 10/08/14 2336 10/10/14 0536  AST 24 18  ALT 16 12  ALKPHOS 106 71  BILITOT 0.4 0.4  PROT 7.3 5.2*  ALBUMIN 4.0 2.7*   No results for input(s): LIPASE, AMYLASE in the last 168 hours. No results for input(s): AMMONIA in the last 168 hours. CBC:  Recent Labs Lab 10/08/14 2336 10/09/14 0407  10/09/14 0852 10/09/14 2013 10/10/14 0925  WBC 7.7 8.2 6.5 5.1 5.3  NEUTROABS 5.3  --   --   --   --   HGB 12.3 10.6* 9.9* 10.1* 10.5*  HCT 38.2 33.3* 31.2* 32.0* 33.4*  MCV 85.8 85.6 85.5 87.0 88.6  PLT 367 331 319 271 295   Cardiac Enzymes: No results for input(s): CKTOTAL, CKMB, CKMBINDEX, TROPONINI in the last 168 hours. BNP (last 3 results)  Recent Labs  08/30/14 0518  BNP 40.5    ProBNP (last 3 results) No results for input(s): PROBNP in the last 8760 hours.  CBG:  Recent Labs Lab 10/09/14 0817 10/10/14 0907  GLUCAP 110* 113*    Recent Results (from the past 240 hour(s))  MRSA PCR Screening     Status: Abnormal   Collection Time: 10/09/14  4:35 AM  Result Value Ref Range Status   MRSA by PCR POSITIVE (A) NEGATIVE Final    Comment:        The GeneXpert MRSA Assay (FDA approved for NASAL specimens only), is one component of a comprehensive MRSA colonization surveillance program. It is not intended to diagnose MRSA infection nor to guide or monitor treatment for MRSA infections. RESULT CALLED TO, READ BACK BY AND VERIFIED WITH: [facility] MURPHY,RN AT 8127 10/09/14 BY K BARR      Studies: No results found.  Scheduled Meds: . acetaminophen  650 mg Oral TID  . acidophilus  1 capsule Oral Daily  . amLODipine  5 mg Oral Daily  . bisoprolol-hydrochlorothiazide  1 tablet Oral Daily  . Chlorhexidine Gluconate Cloth  6 each Topical Q0600  . cholecalciferol  1,000 Units Oral Daily  . darifenacin  7.5 mg Oral Daily  . FLUoxetine  40 mg Oral Daily  . levofloxacin (LEVAQUIN) IV  500 mg Intravenous Q24H  . levothyroxine  112 mcg Oral QAC breakfast  . mirtazapine  7.5 mg Oral QHS  . mupirocin ointment  1 application Nasal BID  . pantoprazole  40 mg Oral BID  . potassium chloride  40 mEq Oral Once  . sodium chloride  3 mL Intravenous Q12H  . tiZANidine  2 mg Oral TID   Continuous Infusions: . sodium chloride    . dextrose 5 % and 0.9 % NaCl with KCl 20 mEq/L  50 mL/hr at 10/10/14 1437     Time spent: 25 minutes    Zanyia Silbaugh  [facility] Pager [phone]. If 7PM-7AM, please contact night-coverage at [email or web address], password [facility] 10/10/2014, 3:10 PM  LOS: 1 day

## 2014-10-10 NOTE — Progress Notes (Signed)
OT Cancellation Note  Patient Details Name: Brianna Robbins MRN: [id number] DOB: 12/30/1922   Cancelled Treatment:    Reason Eval/Treat Not Completed: Other (comment). Pt is currently on strict bedrest without other activity orders and PT has already spoken with RN about this. Will await increased activity orders before proceeding with eval.   Almon Register [id number] 10/10/2014, 11:56 AM

## 2014-10-10 NOTE — Progress Notes (Signed)
Patient c/o right upper chest pain. Was not able to rate it or describe it, but stated "it isn't getting worse, but it isn't getting better." No distress noted. Vitals stable. Administered prn vicodin. Dr. Sanjuana Letters notified - no new orders received. Will continue to monitor.  Joellen Jersey, RN.

## 2014-10-10 NOTE — Progress Notes (Signed)
PT Cancellation Note  Patient Details Name: Brianna Robbins MRN: [id number] DOB: July 05, 1923   Cancelled Treatment:    Reason Eval/Treat Not Completed: Patient not medically ready. Pt continues to be on strict bed rest. Discussed with RN who states she will attempt to get in touch with MD regarding this. Will continue to follow for appropriateness to complete PT eval.    Rolinda Roan 10/10/2014, 11:47 AM   Rolinda Roan, PT, DPT Acute Rehabilitation Services Pager: [phone]

## 2014-10-10 NOTE — Op Note (Signed)
[facility] [place], [address]   ENDOSCOPY PROCEDURE REPORT  PATIENT: Brianna Robbins, Brianna Robbins  MR#: [id number] BIRTHDATE: 04-Mar-1923 , 89  yrs. old GENDER: female ENDOSCOPIST: Ladene Artist, MD, [facility] REFERRED BY:  [facility] PROCEDURE DATE:  10/10/2014 PROCEDURE:  EGD, diagnostic ASA CLASS:     Class III INDICATIONS:  hematemesis. MEDICATIONS: Fentanyl 25 mcg IV and Versed 2 mg IV TOPICAL ANESTHETIC: Cetacaine Spray DESCRIPTION OF PROCEDURE: After the risks benefits and alternatives of the procedure were thoroughly explained, informed consent was obtained.  The Pentax Gastroscope [id number] endoscope was introduced through the mouth and advanced to the second portion of the duodenum , Without limitations.  The instrument was slowly withdrawn as the mucosa was fully examined.    ESOPHAGUS: There was LA Class C esophagitis (Mucosal breaks continuous between > 2 mucosal folds, but involving less than 75% of the esophageal circumference) noted.   The esophagus was otherwise normal. STOMACH: The mucosa of the stomach appeared normal. DUODENUM: The duodenal mucosa showed no abnormalities in the bulb and 2nd part of the duodenum.  Retroflexed views revealed a 6 cm hiatal hernia. The scope was then withdrawn from the patient and the procedure completed.  COMPLICATIONS: There were no immediate complications.  ENDOSCOPIC IMPRESSION: 1.   LA Class C esophagitis 2.   6 cm hiatal hernia  RECOMMENDATIONS: 1.  Anti-reflux regimen 2.  PPI bid for 8 weeks then PPI qam indefinitely 3.  Office follow up with Dr. Erskine Emery in 1 month 4.  She has an increased risk of rebleeding on anticoagulation until esophagits has healed which typically takes about 6-8 weeks. If anticoagulation is restarted before then she should be monitored closely for rebleeding.  eSigned:  Ladene Artist, MD, [facility] 10/10/2014 8:29 AM

## 2014-10-10 NOTE — Interval H&P Note (Signed)
History and Physical Interval Note:  10/10/2014 8:11 AM  Brianna Robbins  has presented today for surgery, with the diagnosis of Hematemesis.  Anemia  The various methods of treatment have been discussed with the patient and family. After consideration of risks, benefits and other options for treatment, the patient has consented to  Procedure(s): ESOPHAGOGASTRODUODENOSCOPY (EGD) (N/A) as a surgical intervention .  The patient's history has been reviewed, patient examined, no change in status, stable for surgery.  I have reviewed the patient's chart and labs.  Questions were answered to the patient's satisfaction.     Pricilla Riffle. Fuller Plan MD

## 2014-10-10 NOTE — H&P (View-Only) (Signed)
[facility] Consult: 11:04 AM 10/09/2014  LOS: 0 days    Referring Provider: Dr Sanjuana Letters Primary Care Physician:  Tula Nakayama Primary Gastroenterologist:  Dr. Deatra Ina     Reason for Consultation:  Hematemesis and ABL anemia.    HPI: Brianna Robbins is a 81 y.o. female.  Hx GERD, adenomatous colon polyps and sigmoid diverticulosis in 2008.  GERD on Omeprazole 20 mg at home.  Hypothyroidism.  CVA.  Dementia Hematochezia and transfusion requiring anemia due to diverticulosis in 09/2007.  CT then showed sigmoid tics with mild wall thickening is sigmoid, cecum.   11/2010 ultrasound showed uncomplicated cholelithiasis, fatty liver.  11/2010 HIDA scan was normal.   S/p 08/28/14 left hip hemi arthroplasty after fall related fracture. Hypoxia, ARF during admission.  CT angio chest 08/31/14: small bil PE, large HH, T12 compression fx. Started on Eliquis. Large HH also noted on concomitant chest x ray  Admitted 2/24 with new hematemesis, dark colored blood.  Per her niece, the patient had an episode of hematemesis 2 weeks ago and again last week. In the interim, although she was not eating well, she was not vomiting. She has not had melena, constipation, diarrhea or bloody stool.  Overnight and this morning she has not had any recurrent episodes of emesis. She denies abdominal pain, odynophagia, dysphagia. She has not had nosebleeds.  Hgb 9.7 on 1/18, 12.3 on 2/23,  9.9 today.  FOBT test 2/24 is negative.  BUN is not elevated.        Past Medical History  Diagnosis Date  . Arthritis   . Hypertension   . Thyroid disease   . Stroke   . Diverticulosis   . HLD (hyperlipidemia)   . Anxiety   . Depression   . GERD (gastroesophageal reflux disease)   . OP (osteoporosis)   . Hypokalemia     Past Surgical History   Procedure Laterality Date  . Abdominal hysterectomy    . Tonsillectomy    . Hip arthroplasty Left 08/28/2014    Procedure: LEFT HEMI HIP;  Surgeon: Kerin Salen, MD;  Location: [facility];  Service: Orthopedics;  Laterality: Left;    Prior to Admission medications   Medication Sig Start Date End Date Taking? Authorizing Provider  acetaminophen (TYLENOL) 325 MG tablet Take 650 mg by mouth 3 (three) times daily. 8 am, 1 pm, & 8 pm   Yes Historical Provider, MD  amLODipine (NORVASC) 5 MG tablet Take 5 mg by mouth daily.   Yes Historical Provider, MD  apixaban (ELIQUIS) 5 MG TABS tablet Take 1 tablet (5 mg total) by mouth 2 (two) times daily. Start after 6 days of 10 mg BID 09/08/14  Yes Costin Karlyne Greenspan, MD  Cholecalciferol (VITAMIN D3) 1000 UNITS CAPS Take 1,000 Units by mouth daily.   Yes Historical Provider, MD  Cranberry 475 MG CAPS Take 475 mg by mouth 2 (two) times daily.   Yes Historical Provider, MD  FLUoxetine (PROZAC) 40 MG capsule Take 40 mg by mouth daily.   Yes Historical Provider, MD  HYDROcodone-acetaminophen [facility])  5-325 MG per tablet Take 1 tablet by mouth every 6 (six) hours as needed. Patient taking differently: Take 1 tablet by mouth every 6 (six) hours as needed for moderate pain.  08/28/14  Yes Leighton Parody, PA-C  levothyroxine (SYNTHROID, LEVOTHROID) 125 MCG tablet Take 125 mcg by mouth daily before breakfast.   Yes Historical Provider, MD  LORazepam (ATIVAN) 0.5 MG tablet Take 0.5 mg by mouth every 6 (six) hours as needed for anxiety. As needed for anxiety    Yes Historical Provider, MD  meclizine (ANTIVERT) 12.5 MG tablet Take 12.5 mg by mouth every 4 (four) hours as needed for dizziness or nausea.   Yes Historical Provider, MD  mirtazapine (REMERON) 7.5 MG tablet Take 7.5 mg by mouth at bedtime.   Yes Historical Provider, MD  Probiotic Product (PROBIOTIC PO) Take 1 capsule by mouth daily.   Yes Historical Provider, MD  ranitidine (ZANTAC) 300 MG tablet Take 300 mg by  mouth every morning.   Yes Historical Provider, MD  solifenacin (VESICARE) 5 MG tablet Take 5 mg by mouth daily.   Yes Historical Provider, MD  traMADol (ULTRAM) 50 MG tablet Take 50 mg by mouth every 6 (six) hours as needed for moderate pain.    Yes Historical Provider, MD  apixaban (ELIQUIS) 5 MG TABS tablet Take 2 tablets (10 mg total) by mouth 2 (two) times daily. For 6 days then 5 mg BID Patient not taking: Reported on 10/09/2014 09/02/14   Caren Griffins, MD  bisoprolol-hydrochlorothiazide [facility]) 5-6.25 MG per tablet Take 1 tablet by mouth daily.    Historical Provider, MD  ciprofloxacin (CIPRO) 500 MG tablet Take 1 tablet (500 mg total) by mouth 2 (two) times daily. For 2 additional days Patient not taking: Reported on 10/09/2014 09/02/14   Caren Griffins, MD  Cranberry 425 MG CAPS Take 850 mg by mouth 2 (two) times daily.     Historical Provider, MD  levothyroxine (SYNTHROID, LEVOTHROID) 112 MCG tablet Take 112 mcg by mouth daily before breakfast.    Historical Provider, MD  omeprazole (PRILOSEC) 20 MG capsule Take 20 mg by mouth daily.    Historical Provider, MD  tizanidine (ZANAFLEX) 2 MG capsule Take 1 capsule (2 mg total) by mouth 3 (three) times daily. Patient not taking: Reported on 10/09/2014 08/28/14   Leighton Parody, PA-C    Scheduled Meds: . acetaminophen  650 mg Oral TID  . acidophilus  1 capsule Oral Daily  . amLODipine  5 mg Oral Daily  . bisoprolol-hydrochlorothiazide  1 tablet Oral Daily  . Chlorhexidine Gluconate Cloth  6 each Topical Q0600  . cholecalciferol  1,000 Units Oral Daily  . darifenacin  7.5 mg Oral Daily  . FLUoxetine  40 mg Oral Daily  . levothyroxine  112 mcg Oral QAC breakfast  . mirtazapine  7.5 mg Oral QHS  . mupirocin ointment  1 application Nasal BID  . pantoprazole (PROTONIX) IV  40 mg Intravenous Q12H  . sodium chloride  3 mL Intravenous Q12H  . tiZANidine  2 mg Oral TID   Infusions: . dextrose 5 % and 0.9 % NaCl with KCl 20 mEq/L     PRN  Meds: HYDROcodone-acetaminophen, LORazepam, meclizine, morphine injection, traMADol   Allergies as of 10/08/2014 - Review Complete 10/08/2014  Allergen Reaction Noted  . Sulfa antibiotics Rash 10/16/2011    No family history on file.  History   Social History  . Marital Status: Married    Spouse Name: N/A  .  Number of Children: N/A  . Years of Education: N/A   Occupational History  . Not on file.   Social History Main Topics  . Smoking status: Never Smoker   . Smokeless tobacco: Not on file  . Alcohol Use: No  . Drug Use: No  . Sexual Activity: Not on file   Other Topics Concern  . Not on file   Social History Narrative    REVIEW OF SYSTEMS: Constitutional:  Patient's family stated the patient has felt depressed since her hip fracture and transition from assisted living, where she could play bingo and engage in social activities, into skilled nursing.   ENT:  No nose bleeds Pulm:  No dyspnea, no cough. CV:  No palpitations, no LE edema. No chest pain GU:  No hematuria, no frequency GI:  Per HPI  Heme:  Per HPI   Transfusions:  Per HPI Neuro:  No headaches, no peripheral tingling or numbness Derm:  No itching, no rash or sores.  Endocrine:  No sweats or chills.  No polyuria or dysuria Immunization:  Family believes that her flu and pneumococcal vaccinations are up-to-date. Travel:  None beyond local counties in last few months.    PHYSICAL EXAM: Vital signs in last 24 hours: Filed Vitals:   10/09/14 1015  BP: 143/58  Pulse: 79  Temp: 98.2 F (36.8 C)  Resp: 18   Wt Readings from Last 3 Encounters:  08/28/14 166 lb 0.1 oz (75.3 kg)  10/16/11 175 lb (79.379 kg)   General: Tired but easily aroused, pleasant, comfortable elderly WF. She does not appear acutely ill. No pallor. Head:  No facial asymmetry or edema. No signs of head trauma.  Eyes:  No scleral icterus, no conjunctival pallor. PERRLA. Ears:  No obvious hearing deficits.  Nose:  No congestion  or nasal discharge Mouth:  Mucous membranes are dry. No blood or lesions in the oral mucosa. Neck:  No TMG, no JVD, no masses. Lungs:  non labored breathing. No cough. Lungs are clear to auscultation bilaterally. Heart: RRR. No MRG. S1-S2 audible. Abdomen:  Soft, NT, ND.  No bruits, masses or HSM. Bowel sounds normoactive..   Rectal: Rectal vault is smooth with good tone. There is no stool palpable or on the exam glove. No blood on exam glove.   Musc/Skeltl: No joint swelling, contractures or redness. Well intact and healing erythematous scar consistent with hip surgery on the left. Extremities:  No pedal edema.  Neurologic:  Patient is alert and asks appropriate questions. She is not oriented to year. However she knows she is in the hospital and her name. She recognizes family and acknowledges my presence.  No tremor. No gross neurologic deficits. Skin:  No telangiectasia, no sores, no rash. Tattoos:  None Nodes:  No cervical or inguinal adenopathy.   Psych:  Pleasant, calm and relaxed.  Intake/Output from previous day: 02/23 0701 - 02/24 0700 In: -  Out: 3 [Urine:3] Intake/Output this shift: Total I/O In: -  Out: 1 [Urine:1]  LAB RESULTS:  Recent Labs  10/08/14 2336 10/09/14 0407 10/09/14 0852  WBC 7.7 8.2 6.5  HGB 12.3 10.6* 9.9*  HCT 38.2 33.3* 31.2*  PLT 367 331 319   BMET Lab Results  Component Value Date   NA 137 10/09/2014   NA 138 10/08/2014   NA 137 09/01/2014   K 2.9* 10/09/2014   K 2.7* 10/08/2014   K 3.3* 09/02/2014   CL 104 10/09/2014   CL 98 10/08/2014   CL  97 09/01/2014   CO2 28 10/09/2014   CO2 31 10/08/2014   CO2 31 09/01/2014   GLUCOSE 114* 10/09/2014   GLUCOSE 131* 10/08/2014   GLUCOSE 115* 09/01/2014   BUN 17 10/09/2014   BUN 17 10/08/2014   BUN 22 09/01/2014   CREATININE 0.65 10/09/2014   CREATININE 0.79 10/08/2014   CREATININE 0.74 09/01/2014   CALCIUM 8.5 10/09/2014   CALCIUM 9.5 10/08/2014   CALCIUM 8.6 09/01/2014    LFT  Recent Labs  10/08/14 2336  PROT 7.3  ALBUMIN 4.0  AST 24  ALT 16  ALKPHOS 106  BILITOT 0.4   PT/INR Lab Results  Component Value Date   INR 1.37 10/08/2014   INR 1.0 10/02/2007   Lipase     Component Value Date/Time   LIPASE 51 08/12/2008 2132    RADIOLOGY STUDIES: No results found.  ENDOSCOPIC STUDIES: 06/2007  Colonoscopy.  Dr Deatra Ina.  Average risk screening 2 cecal polyps (adenomatous without HGD).  Diffuse sigmoid diverticulosis.    IMPRESSION:   *  Hematemesis.  Rule out ulcer, r/o Cameron's lesion in pt with large HH.  R/o AVM.    *  Small bil PE 08/31/14.  Treated with Eliquis, now on hold.   *  ABL anemia.  *  Hypokalemia.   *  S/p 08/28/14 left hip fracture and hemi arthroplasty.   *  Adenomatous colon polyps and sigmoid tics on colonoscopy 2008.    *  Alzheimer's dementia, by my observation today this is not an advanced case of dementia.    PLAN:     *  EGD arranged for 10/10/14 at 0800.  Continue twice a day IV Protonix. Okay to have sips of clears until she is again nothing by mouth after midnight tonight.  *  Given her DO NOT RESUSCITATE status, question as to whether she needs to be continued on telemetry. Also the nieces relay patient and family wishes to not to pursue unnecessary invasive procedures. They are okay with and consenting to the upper endoscopy but has for surgery, angiograms and such they may decide to decline such interventions in an elderly patient with dementia and "poor quality of life" (their words).   Azucena Freed  10/09/2014, 11:04 AM Pager: [phone]      Attending physician's note   I have taken a history, examined the patient and reviewed the chart. I agree with the Advanced Practitioner's note, impression and recommendations. Hematemesis in setting of Eliquis and recent PEs. ABL anemia. Pt has advanced dementia and is DNR. Hold Eliquis. IV PPI. Monitor Hb/Hct. EGD tomorrow to allow Eliquis to clear.    Ladene Artist, MD [facility] Regal

## 2014-10-11 ENCOUNTER — Encounter (HOSPITAL_COMMUNITY): Payer: Self-pay | Admitting: Gastroenterology

## 2014-10-11 LAB — BASIC METABOLIC PANEL
Anion gap: 5 (ref 5–15)
BUN: 13 mg/dL (ref 6–23)
CO2: 23 mmol/L (ref 19–32)
Calcium: 8.5 mg/dL (ref 8.4–10.5)
Chloride: 106 mmol/L (ref 96–112)
Creatinine, Ser: 0.84 mg/dL (ref 0.50–1.10)
GFR calc Af Amer: 69 mL/min — ABNORMAL LOW (ref >90)
GFR, EST NON AFRICAN AMERICAN: 60 mL/min — AB (ref >90)
Glucose, Bld: 111 mg/dL — ABNORMAL HIGH (ref 70–99)
POTASSIUM: 4 mmol/L (ref 3.5–5.1)
SODIUM: 134 mmol/L — AB (ref 135–145)

## 2014-10-11 LAB — CBC
HEMATOCRIT: 34.7 % — AB (ref 36.0–46.0)
Hemoglobin: 10.7 g/dL — ABNORMAL LOW (ref 12.0–15.0)
MCH: 27.1 pg (ref 26.0–34.0)
MCHC: 30.8 g/dL (ref 30.0–36.0)
MCV: 87.8 fL (ref 78.0–100.0)
Platelets: 297 10*3/uL (ref 150–400)
RBC: 3.95 MIL/uL (ref 3.87–5.11)
RDW: 15.1 % (ref 11.5–15.5)
WBC: 6.7 10*3/uL (ref 4.0–10.5)

## 2014-10-11 LAB — GLUCOSE, CAPILLARY: Glucose-Capillary: 150 mg/dL — ABNORMAL HIGH (ref 70–99)

## 2014-10-11 MED ORDER — LEVOFLOXACIN 250 MG PO TABS
250.0000 mg | ORAL_TABLET | ORAL | Status: DC
  Administered 2014-10-11 – 2014-10-12 (×2): 250 mg via ORAL
  Filled 2014-10-11 (×2): qty 1

## 2014-10-11 MED ORDER — WHITE PETROLATUM GEL
Status: AC
  Filled 2014-10-11: qty 1

## 2014-10-11 NOTE — Care Management Note (Unsigned)
    Page 1 of 1   10/11/2014     5:08:03 PM CARE MANAGEMENT NOTE 10/11/2014  Patient:  Brianna Robbins, Brianna Robbins   Account Number:  [id number]  Date Initiated:  10/11/2014  Documentation initiated by:  Delsin Copen  Subjective/Objective Assessment:   Pt adm on 10/08/14 with GI bleed.     Action/Plan:   Anticipated DC Date:  10/14/2014   Anticipated DC Plan:  HOME/SELF CARE         Choice offered to / List presented to:             Status of service:  In process, will continue to follow Medicare Important Message given?  YES (If response is "NO", the following Medicare IM given date fields will be blank) Date Medicare IM given:  10/11/2014 Medicare IM given by:  Kamaryn Grimley Date Additional Medicare IM given:   Additional Medicare IM given by:    Discharge Disposition:    Per UR Regulation:  Reviewed for med. necessity/level of care/duration of stay  If discussed at [place] of Stay Meetings, dates discussed:    Comments:

## 2014-10-11 NOTE — Progress Notes (Signed)
[facility] PROGRESS NOTE  Brianna Robbins CWU:[id number] DOB: August 29, 1922 DOA: 10/08/2014 PCP: Tula Nakayama  Summary I have seen and examined Brianna Robbins at bedside in the presence of her granddaughter and reviewed her chart. Appreciate GI. Brianna Robbins is a pleasant 81 y.o. female with essential hypertension, GERD, hyperlipidemia, history of CVA, hypothyroidism, small pulmonary embolism on Eliquis(diagnosed last month after a fall resulting in femoral fracture), dementia who presented with hematemesis resulting in acute blood loss anemia-hemoglobin dropped from 12.3-9.9 g/dl after admission. She had EGD which showed "1. LA Class C esophagitis 2. 6 cm hiatal hernia". She also has UTI and is hypokalemic, likely from GI loss and poor oral intake. The UTI may have triggered vomiting. Her family states that she has been generally weak since discharge last month. GI suggested that if anticoagulation were to be resumed before esophagitis resolves, patient would likely bleed again hence would need close monitoring of hemoglobin. Family does not want IVC filter. Patient's has decided not to resume anticoagulation and to esophagitis has been treated, understanding that patient may develop another thromboembolic event. Patient seems to be doing better with a stable hemoglobin, now 10.7 g/dl, she is not coughing as much and chest x-ray done on 10/10/14 showed "Mild bibasilar atelectasis. Retrocardiac portion of the left lower lobe is not well evaluated on single AP view however". Urine culture suggested multiple morphotypes. We'll therefore continue antibiotics and PPI as suggested by gastroenterology. If patient continues to do well, she will likely be able to discharge back to skilled nursing facility in the next 24-48 hours. Plan Hematemesis/GERD (gastroesophageal reflux disease)/Acute blood loss anemia/Erosive esophagitis/Hypokalemia  Continue PPI  Replenish potassium as necessary PE (pulmonary  thromboembolism)  No anticoagulation for now. Family offered IVC filter but declined and they realize patient could die from another acute PE. Family will decide regarding when to resume anticoagulation.  SCDs for DVT prophylaxis UTI (urinary tract infection)  Levaquin to complete 7 days of antibiotics Essential hypertension/Dementia/Hypothyroidism  No acute changes  Continue current management Code Status: DNR/DNI Family Communication: Sister at bedside Disposition Plan: Back to SNF   Consultants:  GI  Procedures:  EGD to 2516  Antibiotics:  Levaquin 10/10/2014>  HPI/Subjective: No complaints.  Objective: Filed Vitals:   10/11/14 0803  BP: 125/58  Pulse: 57  Temp: 97.7 F (36.5 C)  Resp: 15    Intake/Output Summary (Last 24 hours) at 10/11/14 1730 Last data filed at 10/10/14 1900  Gross per 24 hour  Intake    440 ml  Output      0 ml  Net    440 ml   Filed Weights   10/09/14 2131 10/10/14 2122  Weight: 103.828 kg (228 lb 14.4 oz) 73.8 kg (162 lb 11.2 oz)    Exam:   General:  Comfortable at rest.  Cardiovascular: S1-S2 normal. No murmurs. Pulse regular.  Respiratory: Good air entry bilaterally. No rhonchi or rales.  Abdomen: Soft and nontender. Normal bowel sounds. No organomegaly.  Musculoskeletal: No pedal edema   Neurological: Intact  Data Reviewed: Basic Metabolic Panel:  Recent Labs Lab 10/08/14 2336 10/09/14 0407 10/10/14 0536 10/11/14 0554  NA 138 137 139 134*  K 2.7* 2.9* 3.3* 4.0  CL 98 104 108 106  CO2 31 28 22 23   GLUCOSE 131* 114* 105* 111*  BUN 17 17 12 13   CREATININE 0.79 0.65 0.64 0.84  CALCIUM 9.5 8.5 8.4 8.5  MG  --  1.8  --   --  Liver Function Tests:  Recent Labs Lab 10/08/14 2336 10/10/14 0536  AST 24 18  ALT 16 12  ALKPHOS 106 71  BILITOT 0.4 0.4  PROT 7.3 5.2*  ALBUMIN 4.0 2.7*   No results for input(s): LIPASE, AMYLASE in the last 168 hours. No results for input(s): AMMONIA in the last 168  hours. CBC:  Recent Labs Lab 10/08/14 2336 10/09/14 0407 10/09/14 0852 10/09/14 2013 10/10/14 0925 10/11/14 0554  WBC 7.7 8.2 6.5 5.1 5.3 6.7  NEUTROABS 5.3  --   --   --   --   --   HGB 12.3 10.6* 9.9* 10.1* 10.5* 10.7*  HCT 38.2 33.3* 31.2* 32.0* 33.4* 34.7*  MCV 85.8 85.6 85.5 87.0 88.6 87.8  PLT 367 331 319 271 295 297   Cardiac Enzymes: No results for input(s): CKTOTAL, CKMB, CKMBINDEX, TROPONINI in the last 168 hours. BNP (last 3 results)  Recent Labs  08/30/14 0518  BNP 40.5    ProBNP (last 3 results) No results for input(s): PROBNP in the last 8760 hours.  CBG:  Recent Labs Lab 10/09/14 0817 10/10/14 0907 10/10/14 1610 10/11/14 0800  GLUCAP 110* 113* 111* 150*    Recent Results (from the past 240 hour(s))  MRSA PCR Screening     Status: Abnormal   Collection Time: 10/09/14  4:35 AM  Result Value Ref Range Status   MRSA by PCR POSITIVE (A) NEGATIVE Final    Comment:        The GeneXpert MRSA Assay (FDA approved for NASAL specimens only), is one component of a comprehensive MRSA colonization surveillance program. It is not intended to diagnose MRSA infection nor to guide or monitor treatment for MRSA infections. RESULT CALLED TO, READ BACK BY AND VERIFIED WITH: NIKKI MURPHY,RN AT 7824 10/09/14 BY K BARR   Culture, Urine     Status: None   Collection Time: 10/09/14  3:58 PM  Result Value Ref Range Status   Specimen Description URINE, CLEAN CATCH  Final   Special Requests NONE  Final   Colony Count   Final    >=100,000 COLONIES/ML Performed at [facility]    Culture   Final    Multiple bacterial morphotypes present, none predominant. Suggest appropriate recollection if clinically indicated. Performed at [facility]    Report Status 10/10/2014 FINAL  Final     Studies: Dg Chest Port 1 View  10/10/2014   CLINICAL DATA:  Urinary tract infection, chest pressure initial evaluation  EXAM: PORTABLE CHEST - 1 VIEW  COMPARISON:   08/30/2014  FINDINGS: Mild to moderate cardiac enlargement stable was stable uncoiling of the aorta. The vascular pattern is normal. Lungs are clear. Mild bibasilar atelectasis.  IMPRESSION: Mild bibasilar atelectasis. Retrocardiac portion of the left lower lobe is not well evaluated on single AP view however.   Electronically Signed   By: Skipper Cliche M.D.   On: 10/10/2014 16:31    Scheduled Meds: . acetaminophen  650 mg Oral TID  . acidophilus  1 capsule Oral Daily  . amLODipine  5 mg Oral Daily  . bisoprolol-hydrochlorothiazide  1 tablet Oral Daily  . Chlorhexidine Gluconate Cloth  6 each Topical Q0600  . cholecalciferol  1,000 Units Oral Daily  . darifenacin  7.5 mg Oral Daily  . FLUoxetine  40 mg Oral Daily  . levofloxacin  250 mg Oral Q24H  . levothyroxine  112 mcg Oral QAC breakfast  . mirtazapine  7.5 mg Oral QHS  . mupirocin ointment  1 application Nasal BID  . pantoprazole  40 mg Oral BID  . sodium chloride  3 mL Intravenous Q12H  . tiZANidine  2 mg Oral TID  . white petrolatum       Continuous Infusions: . sodium chloride 20 mL/hr at 10/11/14 1159     Time spent: 25 minutes    Mahalia Dykes  [facility] Pager [phone]. If 7PM-7AM, please contact night-coverage at [email or web address], password [facility] 10/11/2014, 5:30 PM  LOS: 2 days

## 2014-10-11 NOTE — Evaluation (Signed)
Physical Therapy Evaluation Patient Details Name: Brianna Robbins MRN: [id number] DOB: 1923/06/24 Today's Date: 10/11/2014   History of Present Illness  Brianna Robbins is a 81 y.o. female his past medical history of hypertension, GERD, hyperlipidemia, stroke, hypothyroidism, small pulmonary embolism on Eliquis, who presents with hematemesis. Pt with LTHA one month ago  Clinical Impression  Pt admitted with above diagnosis. Pt currently with functional limitations due to the deficits listed below (see PT Problem List). At the time of PT eval pt was able to perform transfers and ambulation with min assist +2. Per pt's sister, the plan is for the patient to return to SNF with the goal to return to the ALF. Pt will benefit from skilled PT to increase their independence and safety with mobility to allow discharge to the venue listed below.       Follow Up Recommendations SNF;Supervision/Assistance - 24 hour    Equipment Recommendations  None recommended by PT    Recommendations for Other Services       Precautions / Restrictions Precautions Precautions: Fall;Posterior Hip Restrictions Weight Bearing Restrictions: No Other Position/Activity Restrictions: WBAT      Mobility  Bed Mobility Overal bed mobility: Needs Assistance Bed Mobility: Supine to Sit     Supine to sit: Min assist;HOB elevated     General bed mobility comments: Pt was able to transition to EOB with use of rails and assist for trunk support.   Transfers Overall transfer level: Needs assistance Equipment used: Rolling walker (2 wheeled) Transfers: Sit to/from Stand Sit to Stand: Min assist;+2 physical assistance         General transfer comment: VC's for hand palcement on seated surface for safety.   Ambulation/Gait Ambulation/Gait assistance: Min assist;+2 safety/equipment Ambulation Distance (Feet): 20 Feet Assistive device: Rolling walker (2 wheeled) Gait Pattern/deviations: Step-through pattern;Decreased  stride length;Shuffle;Trunk flexed;Narrow base of support Gait velocity: Decreased Gait velocity interpretation: Below normal speed for age/gender General Gait Details: Close chair follow utilized. Pt was able to ambulate with RW and occasional assist for balance or walker positioning.   Stairs            Wheelchair Mobility    Modified Rankin (Stroke Patients Only)       Balance Overall balance assessment: Needs assistance Sitting-balance support: Feet supported;No upper extremity supported Sitting balance-Leahy Scale: Fair     Standing balance support: Bilateral upper extremity supported Standing balance-Leahy Scale: Poor                               Pertinent Vitals/Pain Pain Assessment: Faces Faces Pain Scale: Hurts little more Pain Location: LLE Pain Descriptors / Indicators: Aching;Sore Pain Intervention(s): Monitored during session;Repositioned    Home Living Family/patient expects to be discharged to:: Skilled nursing facility                 Additional Comments: From [facility], will need SNF     Prior Function Level of Independence: Needs assistance   Gait / Transfers Assistance Needed: mainly has gotten around by W/C since hip surgery a month ago  ADL's / Homemaking Assistance Needed: mod-Max A since she hip surgery a month ago        Hand Dominance   Dominant Hand: Right    Extremity/Trunk Assessment   Upper Extremity Assessment: Defer to OT evaluation           Lower Extremity Assessment: Generalized weakness  Cervical / Trunk Assessment: Kyphotic  Communication   Communication: HOH (has a right hearing aid)  Cognition Arousal/Alertness: Awake/alert Behavior During Therapy: WFL for tasks assessed/performed Overall Cognitive Status: History of cognitive impairments - at baseline                      General Comments      Exercises        Assessment/Plan    PT Assessment Patient  needs continued PT services  PT Diagnosis Difficulty walking;Generalized weakness   PT Problem List Decreased strength;Decreased range of motion;Decreased activity tolerance;Decreased balance;Decreased mobility;Decreased knowledge of use of DME;Decreased safety awareness;Decreased knowledge of precautions  PT Treatment Interventions DME instruction;Gait training;Stair training;Functional mobility training;Therapeutic activities;Therapeutic exercise;Neuromuscular re-education;Patient/family education   PT Goals (Current goals can be found in the Care Plan section) Acute Rehab PT Goals Patient Stated Goal: back to rehab PT Goal Formulation: With patient/family Time For Goal Achievement: 10/25/14 Potential to Achieve Goals: Good    Frequency Min 2X/week   Barriers to discharge        Co-evaluation PT/OT/SLP Co-Evaluation/Treatment: Yes Reason for Co-Treatment: For patient/therapist safety PT goals addressed during session: Mobility/safety with mobility;Balance;Proper use of DME OT goals addressed during session: ADL's and self-care;Strengthening/ROM       End of Session Equipment Utilized During Treatment: Gait belt Activity Tolerance: Patient tolerated treatment well Patient left: in chair;with call bell/phone within reach;with chair alarm set;with family/visitor present Nurse Communication: Mobility status         Time: 6468-0321 PT Time Calculation (min) (ACUTE ONLY): 26 min   Charges:   PT Evaluation $Initial PT Evaluation Tier I: 1 Procedure     PT G CodesRolinda Roan Nov 10, 2014, 1:38 PM   Rolinda Roan, PT, DPT Acute Rehabilitation Services Pager: [phone]

## 2014-10-11 NOTE — Progress Notes (Signed)
PHARMACIST - PHYSICIAN COMMUNICATION DR:   Sanjuana Letters CONCERNING: Antibiotic IV to Oral Route Change Policy  RECOMMENDATION: This patient is receiving Levaquin by the intravenous route.  Based on criteria approved by the Pharmacy and Therapeutics Committee, the antibiotic(s) is/are being converted to the equivalent oral dose form(s).   DESCRIPTION: These criteria include:  Patient being treated for a respiratory tract infection, urinary tract infection, cellulitis or clostridium difficile associated diarrhea if on metronidazole  The patient is not neutropenic and does not exhibit a GI malabsorption state  The patient is eating (either orally or via tube) and/or has been taking other orally administered medications for a least 24 hours  The patient is improving clinically and has a Tmax < 100.5  If you have questions about this conversion, please contact the Pharmacy Department  []   [phone] )  [facility] [x]   [phone] )  [facility]  []   [phone] )  [facility] []   [phone] )  [facility]   Crcl~46.  Levaquin dose adjusted to 250mg  PO q24h for renal function and indication.  Dia Sitter, PharmD, BCPS

## 2014-10-11 NOTE — Clinical Social Work Note (Signed)
CSW continuing to follow patient's progress and will assist with discharge back to [facility] skilled nursing facility when medically stable. Handoff completed for weekend CSW.   Kyland No Givens, MSW, LCSW Licensed Clinical Social Worker [place] [phone]

## 2014-10-11 NOTE — Progress Notes (Signed)
OT Cancellation Note  Patient Details Name: Brianna Robbins MRN: [id number] DOB: 1923/01/10   Cancelled Treatment:    Reason Eval/Treat Not Completed: Other (comment). Pt still has bedrest orders, I have texted paged Dr. Sanjuana Letters to see if we can get this changed.  Almon Register [phone] 10/11/2014, 10:04 AM

## 2014-10-11 NOTE — Evaluation (Signed)
Occupational Therapy Evaluation and Discharge Patient Details Name: Brianna Robbins MRN: [id number] DOB: 21-Sep-1922 Today's Date: 10/11/2014    History of Present Illness Brianna Robbins is a 81 y.o. female his past medical history of hypertension, GERD, hyperlipidemia, stroke, hypothyroidism, small pulmonary embolism on Eliquis, who presents with hematemesis. Pt with LTHA one month ago   Clinical Impression    This 81 yo female admitted with above presents to acute OT with decreased cognition, decreased balance, decreased mobility, and increased pain all affecting her ability to care for herself. She will benefit from continued OT at SNF, we will sign off.    Follow Up Recommendations  SNF    Equipment Recommendations   (TBD at next venue)       Precautions / Restrictions Precautions Precautions: Fall;Posterior Hip Restrictions Weight Bearing Restrictions: No Other Position/Activity Restrictions: WBAT      Mobility Bed Mobility Overal bed mobility: Needs Assistance Bed Mobility: Supine to Sit     Supine to sit: Min assist;HOB elevated (and use of rails)        Transfers Overall transfer level: Needs assistance Equipment used: Rolling walker (2 wheeled) Transfers: Sit to/from Stand Sit to Stand: Min assist;+2 physical assistance         General transfer comment: VCs for safe hand placement    Balance Overall balance assessment: Needs assistance Sitting-balance support: No upper extremity supported;Feet supported Sitting balance-Leahy Scale: Fair     Standing balance support: Bilateral upper extremity supported Standing balance-Leahy Scale: Poor                              ADL Overall ADL's : Needs assistance/impaired Eating/Feeding: Independent;Sitting   Grooming: Set up;Sitting   Upper Body Bathing: Set up;Sitting   Lower Body Bathing: Moderate assistance (with min A+2 sit<>stand)   Upper Body Dressing : Set up;Sitting   Lower Body  Dressing: Maximal assistance (with min A +2 sit<>stand)   Toilet Transfer: Minimal assistance;+2 for physical assistance;Ambulation;RW (bed>around to door>sit in recliner behind her)   Toileting- Water quality scientist and Hygiene: Moderate assistance (with min A +2 sit<>stand)                         Pertinent Vitals/Pain Pain Assessment: Faces Faces Pain Scale: Hurts little more Pain Location: LLE Pain Descriptors / Indicators: Aching;Sore Pain Intervention(s): Monitored during session;Repositioned     Hand Dominance Right   Extremity/Trunk Assessment Upper Extremity Assessment Upper Extremity Assessment: Overall WFL for tasks assessed   Lower Extremity Assessment Lower Extremity Assessment: Defer to PT evaluation       Communication Communication Communication: HOH (has a right hearing aid)   Cognition Arousal/Alertness: Awake/alert Behavior During Therapy: WFL for tasks assessed/performed Overall Cognitive Status: History of cognitive impairments - at baseline                                Home Living Family/patient expects to be discharged to:: Skilled nursing facility                                        Prior Functioning/Environment Level of Independence: Needs assistance  Gait / Transfers Assistance Needed: mainly has gotten around by W/C since hip surgery a month ago ADL's / [employer]  Needed: mod-Max A since she hip surgery a month ago        OT Diagnosis: Generalized weakness;Acute pain   OT Problem List: Decreased strength;Decreased range of motion;Impaired balance (sitting and/or standing);Pain;Decreased cognition      OT Goals(Current goals can be found in the care plan section) Acute Rehab OT Goals Patient Stated Goal: back to rehab  OT Frequency:             Co-evaluation PT/OT/SLP Co-Evaluation/Treatment: Yes Reason for Co-Treatment: For patient/therapist safety   OT goals addressed  during session: ADL's and self-care;Strengthening/ROM      End of Session Equipment Utilized During Treatment: Gait belt;Rolling walker Nurse Communication: Mobility status  Activity Tolerance: Patient tolerated treatment well Patient left: in chair;with call bell/phone within reach;with chair alarm set;with family/visitor present   Time: 1131-1157 OT Time Calculation (min): 26 min Charges:  OT General Charges $OT Visit: 1 Procedure OT Evaluation $Initial OT Evaluation Tier I: 1 Procedure  Almon Register [phone] 10/11/2014, 12:22 PM

## 2014-10-12 DIAGNOSIS — R110 Nausea: Secondary | ICD-10-CM

## 2014-10-12 LAB — BASIC METABOLIC PANEL
Anion gap: 5 (ref 5–15)
BUN: 8 mg/dL (ref 6–23)
CALCIUM: 8.4 mg/dL (ref 8.4–10.5)
CHLORIDE: 109 mmol/L (ref 96–112)
CO2: 23 mmol/L (ref 19–32)
Creatinine, Ser: 0.67 mg/dL (ref 0.50–1.10)
GFR calc Af Amer: 88 mL/min — ABNORMAL LOW (ref >90)
GFR, EST NON AFRICAN AMERICAN: 76 mL/min — AB (ref >90)
Glucose, Bld: 95 mg/dL (ref 70–99)
POTASSIUM: 4 mmol/L (ref 3.5–5.1)
SODIUM: 137 mmol/L (ref 135–145)

## 2014-10-12 LAB — CBC
HEMATOCRIT: 31.6 % — AB (ref 36.0–46.0)
HEMOGLOBIN: 9.7 g/dL — AB (ref 12.0–15.0)
MCH: 27.2 pg (ref 26.0–34.0)
MCHC: 30.7 g/dL (ref 30.0–36.0)
MCV: 88.5 fL (ref 78.0–100.0)
Platelets: 269 10*3/uL (ref 150–400)
RBC: 3.57 MIL/uL — AB (ref 3.87–5.11)
RDW: 15.1 % (ref 11.5–15.5)
WBC: 4.4 10*3/uL (ref 4.0–10.5)

## 2014-10-12 LAB — GLUCOSE, CAPILLARY: Glucose-Capillary: 94 mg/dL (ref 70–99)

## 2014-10-12 MED ORDER — HYDROCODONE-ACETAMINOPHEN 5-325 MG PO TABS: 1.0000 | ORAL_TABLET | Freq: Four times a day (QID) | ORAL | Status: AC | PRN

## 2014-10-12 MED ORDER — PANTOPRAZOLE SODIUM 40 MG PO TBEC: 40.0000 mg | DELAYED_RELEASE_TABLET | Freq: Two times a day (BID) | ORAL | Status: AC

## 2014-10-12 MED ORDER — AMOXICILLIN-POT CLAVULANATE 500-125 MG PO TABS: 1.0000 | ORAL_TABLET | Freq: Three times a day (TID) | ORAL | Status: AC

## 2014-10-12 MED ORDER — LORAZEPAM 0.5 MG PO TABS: 0.5000 mg | ORAL_TABLET | Freq: Four times a day (QID) | ORAL | Status: AC | PRN

## 2014-10-12 NOTE — Discharge Summary (Signed)
Brianna Robbins, is a 81 y.o. female  DOB May 10, 1923  MRN [id number].  Admission date:  10/08/2014  Admitting Physician  Ivor Costa, MD  Discharge Date:  10/12/2014   Primary MD  Tula Nakayama  Recommendations for primary care physician for things to follow:  Please follow hemoglobin in the next week or 2 and decide when and if appropriate to resume anticoagulation per Gi recommendations.   Admission Diagnosis   Anticoagulant long-term use [Z79.01] Hematemesis without nausea [K92.0]   Discharge Diagnosis  Anticoagulant long-term use [Z79.01] Hematemesis without nausea [K92.0]   Active Problems:   GERD (gastroesophageal reflux disease)   PE (pulmonary thromboembolism)   Essential hypertension   Dementia   Hypothyroidism      Hospital Course  Brianna Robbins is a pleasant 81 y.o. female with essential hypertension, GERD, hyperlipidemia, history of CVA, hypothyroidism, small pulmonary embolism on Eliquis(diagnosed last month after a fall resulting in femoral fracture), Alzheimer's dementia, who presented with hematemesis resulting in acute blood loss anemia-hemoglobin dropped from 12.3-9.9 g/dl after admission. She had EGD which showed "1. LA Class C esophagitis 2. 6 cm hiatal hernia". She was also found to have UTI and to be hypokalemic, likely from GI loss and poor oral intake. The UTI may have triggered vomiting hence the cascade leading to hematemesis/gastric erosions while on Eliquis.  Her family states that she has been generally weak since discharge last month. GI suggested that if anticoagulation were to be resumed before esophagitis resolves(will be on bid dosing of ppi for 8 weeks), patient would likely bleed again hence would need close monitoring of hemoglobin. Family was offered but declined IVC  filter. They have decided not to resume anticoagulation atill esophagitis has been treated, understanding that patient may develop another thromboembolic event and potentially die from it- they don't believe she has a great quality of life to start with, and they say she is ready to go when the time comes. Patient has done ok with a stable hemoglobin, now 9.7 g/dl, she is not coughing as much and chest x-ray done on 10/10/14 showed "Mild bibasilar atelectasis. Retrocardiac portion of the left lower lobe is not well evaluated on single AP view however". Urine culture suggested multiple morphotypes. Will therefore discharge her on Augmentin for 5 more days and PPI as suggested by gastroenterology(8 weeks of bid dosing of protonix, then daily dosing indefinitely). Will defer the appropriateness of reintroducing anticoagulation to her PCP, dependent on resolution of esophagitis. Would monitor hemoglobin and decide when to resume Eliquis depending on the hemoglobin remaining stable, hopefully in the next couple of weeks(family in no hurry to resume Eliquis as they are more concerned about her bleeding again than her developing blood clot). I have gone over the discharge plan with patient's sister at bedside and will transfer patient back to skilled nursing facility today if bed available.  Discharge Condition Stable.  Consults obtained  GI  Follow UP  PCP at skilled nursing facility   Discharge Instructions  and  Discharge Medications  Discharge Instructions    Diet - low sodium heart healthy    Complete by:  As directed      Increase activity slowly    Complete by:  As directed             Medication List    STOP taking these medications        apixaban 5 MG Tabs tablet  Commonly known as:  ELIQUIS     ciprofloxacin 500 MG tablet  Commonly known as:  CIPRO     omeprazole 20 MG capsule  Commonly known as:  PRILOSEC     ranitidine 300 MG tablet  Commonly known as:  ZANTAC      tizanidine 2 MG capsule  Commonly known as:  ZANAFLEX      TAKE these medications        acetaminophen 325 MG tablet  Commonly known as:  TYLENOL  Take 650 mg by mouth 3 (three) times daily. 8 am, 1 pm, & 8 pm     amLODipine 5 MG tablet  Commonly known as:  NORVASC  Take 5 mg by mouth daily.     amoxicillin-clavulanate 500-125 MG per tablet  Commonly known as:  AUGMENTIN  Take 1 tablet (500 mg total) by mouth 3 (three) times daily.     bisoprolol-hydrochlorothiazide 5-6.25 MG per tablet  Commonly known as:  ZIAC  Take 1 tablet by mouth daily.     Cranberry 425 MG Caps  Take 850 mg by mouth 2 (two) times daily.     FLUoxetine 40 MG capsule  Commonly known as:  PROZAC  Take 40 mg by mouth daily.     HYDROcodone-acetaminophen 5-325 MG per tablet  Commonly known as:  NORCO  Take 1 tablet by mouth every 6 (six) hours as needed.     levothyroxine 112 MCG tablet  Commonly known as:  SYNTHROID, LEVOTHROID  Take 112 mcg by mouth daily before breakfast.     LORazepam 0.5 MG tablet  Commonly known as:  ATIVAN  Take 1 tablet (0.5 mg total) by mouth every 6 (six) hours as needed for anxiety. As needed for anxiety     meclizine 12.5 MG tablet  Commonly known as:  ANTIVERT  Take 12.5 mg by mouth every 4 (four) hours as needed for dizziness or nausea.     mirtazapine 7.5 MG tablet  Commonly known as:  REMERON  Take 7.5 mg by mouth at bedtime.     pantoprazole 40 MG tablet  Commonly known as:  PROTONIX  Take 1 tablet (40 mg total) by mouth 2 (two) times daily.     PROBIOTIC PO  Take 1 capsule by mouth daily.     solifenacin 5 MG tablet  Commonly known as:  VESICARE  Take 5 mg by mouth daily.     traMADol 50 MG tablet  Commonly known as:  ULTRAM  Take 50 mg by mouth every 6 (six) hours as needed for moderate pain.     Vitamin D3 1000 UNITS Caps  Take 1,000 Units by mouth daily.        Diet and Activity recommendation: See Discharge Instructions above  Major  procedures and Radiology Reports - PLEASE review detailed and final reports for all details, in brief -    Dg Chest Port 1 View  10/10/2014   CLINICAL DATA:  Urinary tract infection, chest pressure initial evaluation  EXAM: PORTABLE CHEST - 1 VIEW  COMPARISON:  08/30/2014  FINDINGS: Mild to moderate  cardiac enlargement stable was stable uncoiling of the aorta. The vascular pattern is normal. Lungs are clear. Mild bibasilar atelectasis.  IMPRESSION: Mild bibasilar atelectasis. Retrocardiac portion of the left lower lobe is not well evaluated on single AP view however.   Electronically Signed   By: Skipper Cliche M.D.   On: 10/10/2014 16:31    Micro Results   Recent Results (from the past 240 hour(s))  MRSA PCR Screening     Status: Abnormal   Collection Time: 10/09/14  4:35 AM  Result Value Ref Range Status   MRSA by PCR POSITIVE (A) NEGATIVE Final    Comment:        The GeneXpert MRSA Assay (FDA approved for NASAL specimens only), is one component of a comprehensive MRSA colonization surveillance program. It is not intended to diagnose MRSA infection nor to guide or monitor treatment for MRSA infections. RESULT CALLED TO, READ BACK BY AND VERIFIED WITH: NIKKI MURPHY,RN AT 0370 10/09/14 BY K BARR   Culture, Urine     Status: None   Collection Time: 10/09/14  3:58 PM  Result Value Ref Range Status   Specimen Description URINE, CLEAN CATCH  Final   Special Requests NONE  Final   Colony Count   Final    >=100,000 COLONIES/ML Performed at [facility]    Culture   Final    Multiple bacterial morphotypes present, none predominant. Suggest appropriate recollection if clinically indicated. Performed at [facility]    Report Status 10/10/2014 FINAL  Final       Today   Subjective:   Brianna Robbins today has no headache,no chest abdominal pain,no new weakness tingling or numbness, feels much better wants to go home today.   Objective:   Blood pressure 129/56,  pulse 55, temperature 98.5 F (36.9 C), temperature source Oral, resp. rate 16, height 5\' 8"  (1.727 m), weight 74.3 kg (163 lb 12.8 oz), SpO2 94 %.   Intake/Output Summary (Last 24 hours) at 10/12/14 1234 Last data filed at 10/12/14 0835  Gross per 24 hour  Intake    150 ml  Output      0 ml  Net    150 ml    Exam Awake Alert, Oriented x 3, No new F.N deficits, Normal affect Cole.AT,PERRAL Supple Neck,No JVD, No cervical lymphadenopathy appriciated.  Symmetrical Chest wall movement, Good air movement bilaterally, CTAB RRR,No Gallops,Rubs or new Murmurs, No Parasternal Heave +ve B.Sounds, Abd Soft, Non tender, No organomegaly appriciated, No rebound -guarding or rigidity. No Cyanosis, Clubbing or edema, No new Rash or bruise  Data Review   CBC w Diff: Lab Results  Component Value Date   WBC 4.4 10/12/2014   HGB 9.7* 10/12/2014   HCT 31.6* 10/12/2014   PLT 269 10/12/2014   LYMPHOPCT 17 10/08/2014   MONOPCT 13* 10/08/2014   EOSPCT 2 10/08/2014   BASOPCT 1 10/08/2014    CMP: Lab Results  Component Value Date   NA 137 10/12/2014   K 4.0 10/12/2014   CL 109 10/12/2014   CO2 23 10/12/2014   BUN 8 10/12/2014   CREATININE 0.67 10/12/2014   PROT 5.2* 10/10/2014   ALBUMIN 2.7* 10/10/2014   BILITOT 0.4 10/10/2014   ALKPHOS 71 10/10/2014   AST 18 10/10/2014   ALT 12 10/10/2014  .   Total Time in preparing paper work, data evaluation and todays exam - 35 minutes  Havish Petties M.D on 10/12/2014 at 12:34 PM  [facility] Office  [phone]

## 2014-10-12 NOTE — Progress Notes (Signed)
Report given to Lattie Haw, Therapist, sports at [facility]. Pt tobe transported per ambulance to facility. Will monitor.   Angeline Slim I 10/12/2014 4:58 PM

## 2014-10-12 NOTE — Clinical Social Work Note (Signed)
CSW made aware patient ready for d/c to [facility]. CSW contacted facility and spoke with Anderson Malta who confirmed bed availability. CSW met with patient's sister Artemio Aly, who was present at bedside. Sister agreeable with d/c plan. CSW faxed d/c summary to facility and prepared d/c packet. CSW placed d/c packet in patient's shadow chart.  CSW provided RN Annamaria with number for room and report. CSW to arrange transportation via [place]. No further needs. CSW signing off.  [place], [place] Weekend Clinical Social Worker [phone]

## 2014-10-12 NOTE — Progress Notes (Signed)
Pt transported out of unit per stretcher with ambulance personnel. No acute distress noted. Pt's personal belongings with pt's sister.   Brianna Robbins I 10/12/2014 5:04 PM

## 2014-11-21 ENCOUNTER — Ambulatory Visit: Payer: BC Managed Care – PPO | Admitting: Gastroenterology

## 2015-01-29 ENCOUNTER — Ambulatory Visit: Payer: Medicare Other | Admitting: Gastroenterology

## 2015-03-17 DEATH — deceased

## 2015-11-14 IMAGING — CT CT ANGIO CHEST
2 of 9 series · 19 of 46 positions shown · IV contrast (omnipaque)
Comparison: Portable chest obtained yesterday. Thoracic spine CT
dated 10/16/2011.

CLINICAL DATA: Dyspnea. Status post left hip replacement 3 days
ago.

EXAM:
CT ANGIOGRAPHY CHEST WITH CONTRAST
TECHNIQUE: Multidetector CT imaging of the chest was performed using the
standard protocol during bolus administration of intravenous
contrast. Multiplanar CT image reconstructions and MIPs were
obtained to evaluate the vascular anatomy.
CONTRAST:  80mL OMNIPAQUE IOHEXOL 350 MG/ML SOLN

[Series 6: thins · axial · 0.53mm/px · z∈[-185,+37]mm · 16 of 246 slices shown]
[im 12/246  lung]
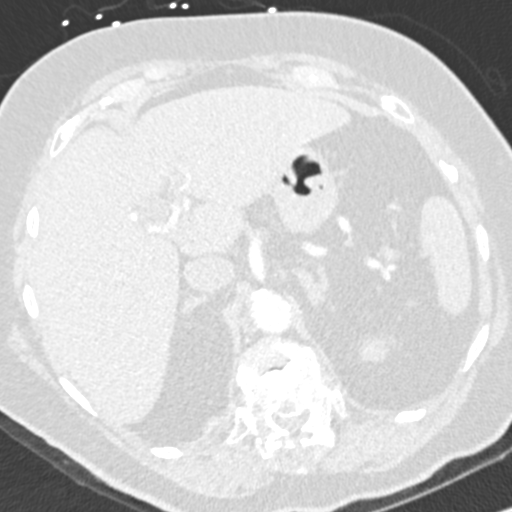
[im 23/246  soft-tissue]
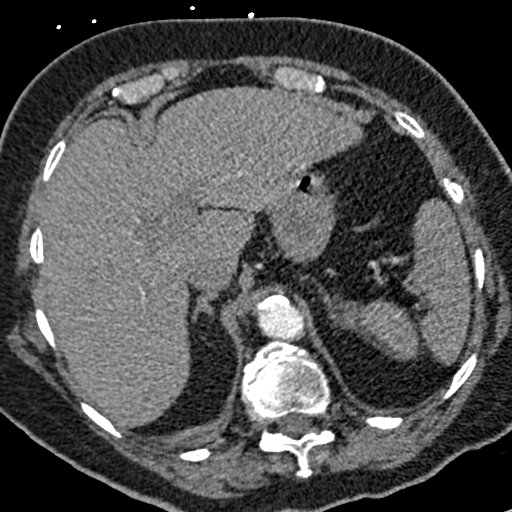
[im 45/246  lung]
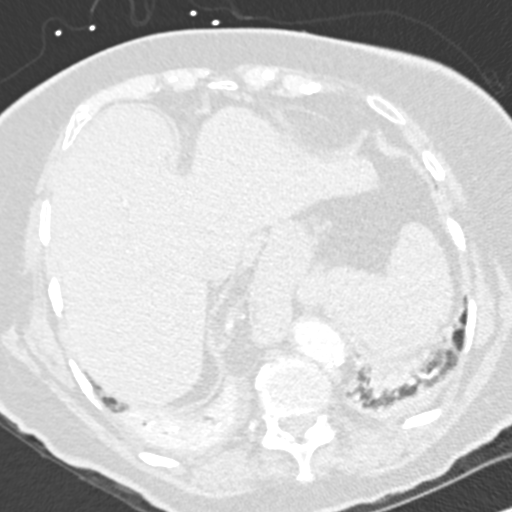
[im 56/246  soft-tissue]
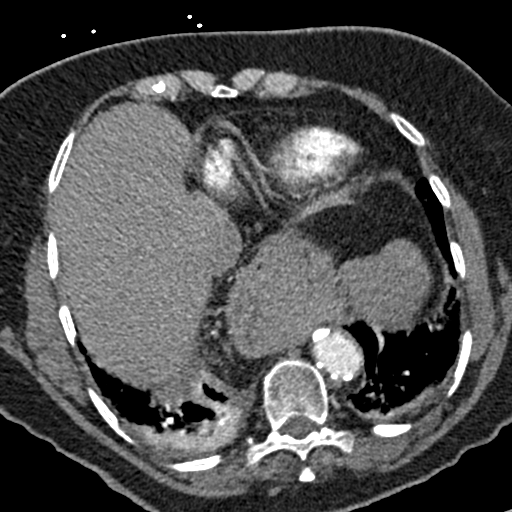
[im 67/246  lung]
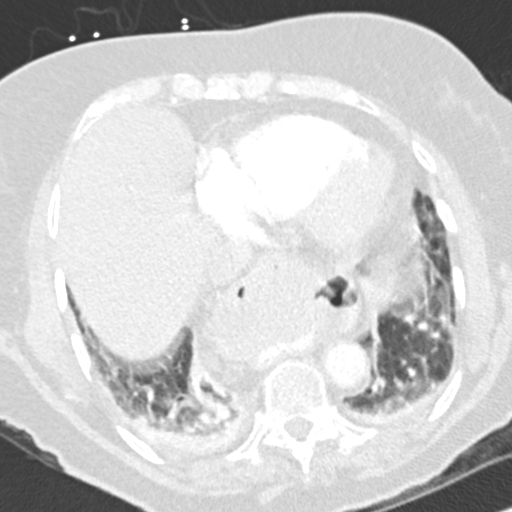
[im 90/246  soft-tissue]
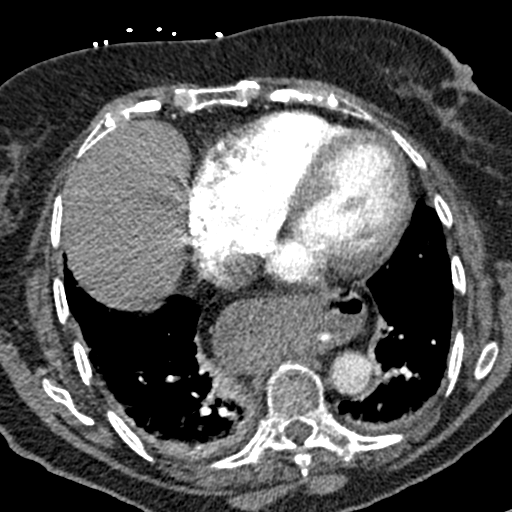
[im 101/246  lung]
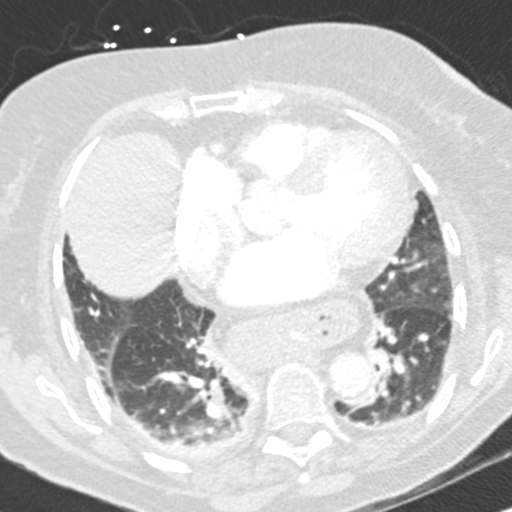
[im 112/246  soft-tissue]
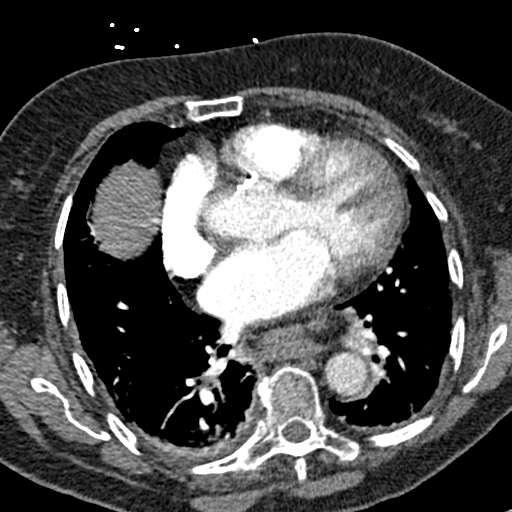
[im 134/246  lung]
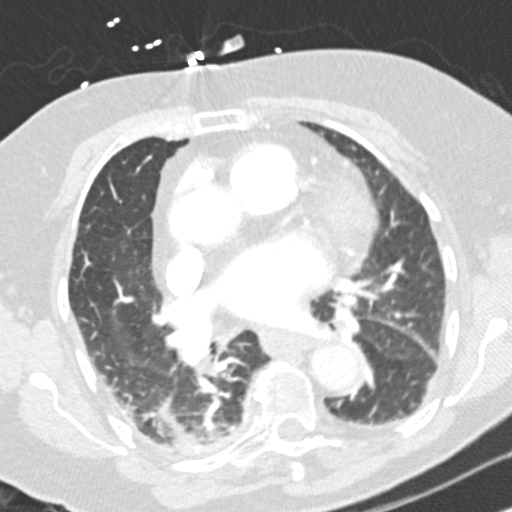
[im 145/246  soft-tissue]
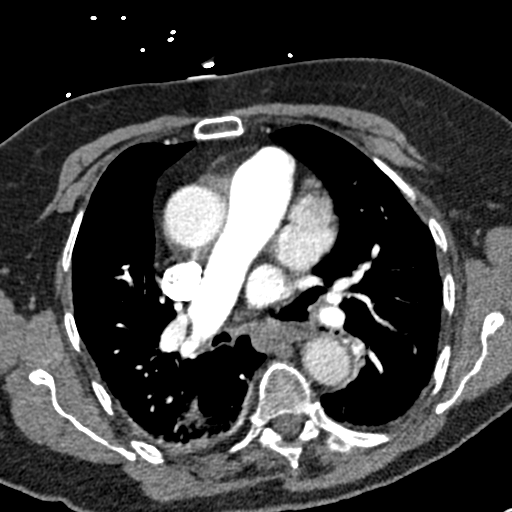
[im 156/246  lung]
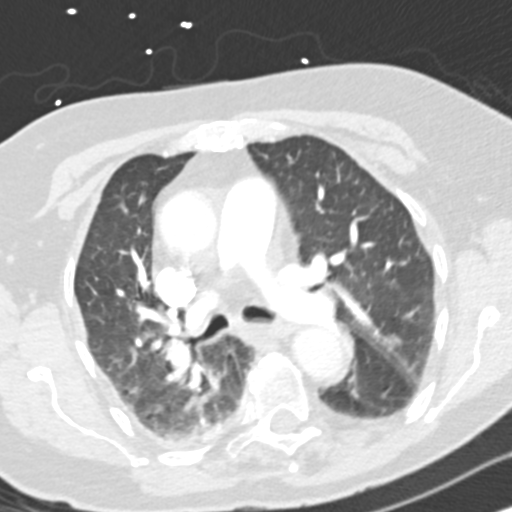
[im 179/246  soft-tissue]
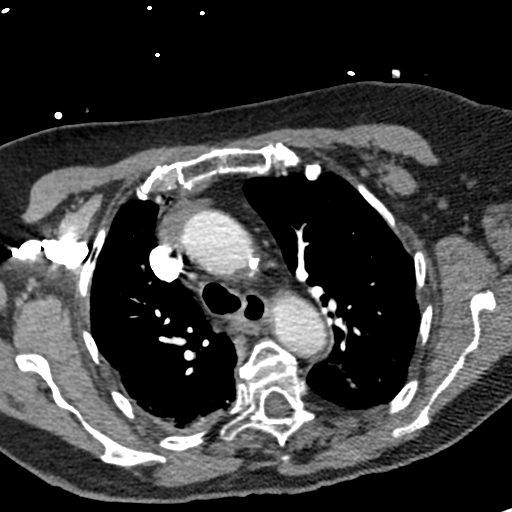
[im 190/246  lung]
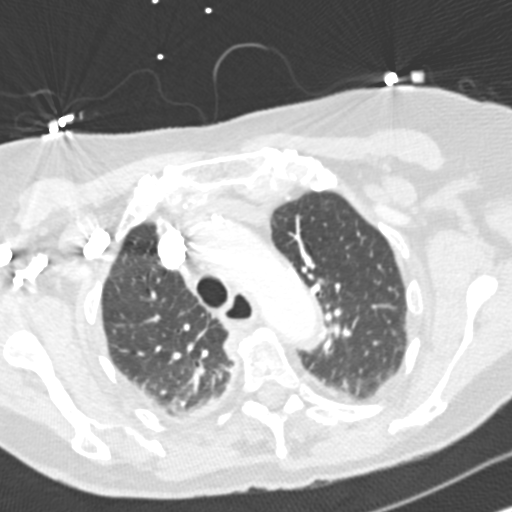
[im 201/246  soft-tissue]
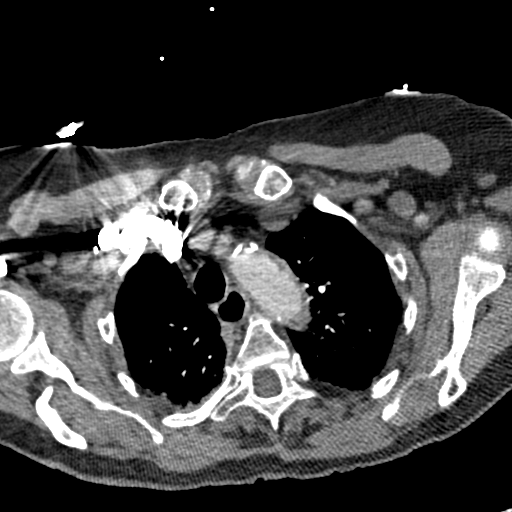
[im 223/246  lung]
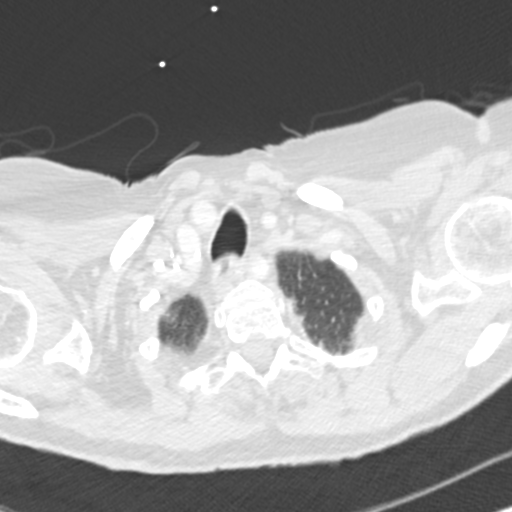
[im 234/246  soft-tissue]
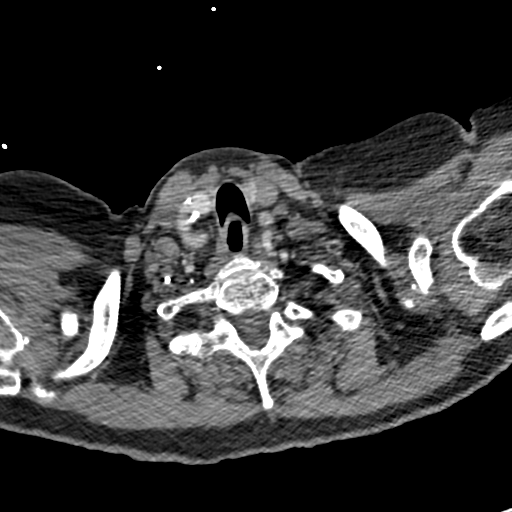

[Series 8: coronal mpr · coronal · 0.59mm/px · 3 of 124 slices shown]
[im 31/124  soft-tissue]
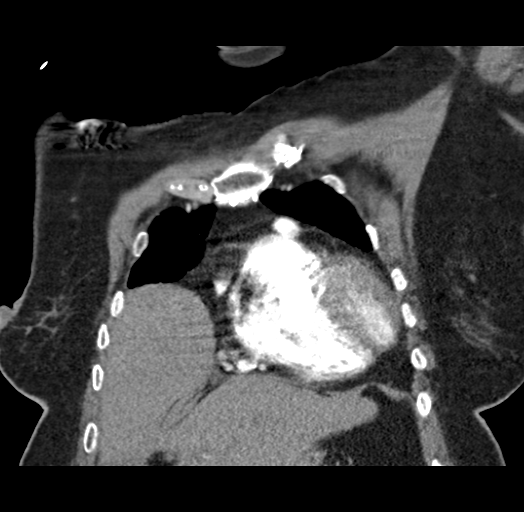
[im 62/124  soft-tissue]
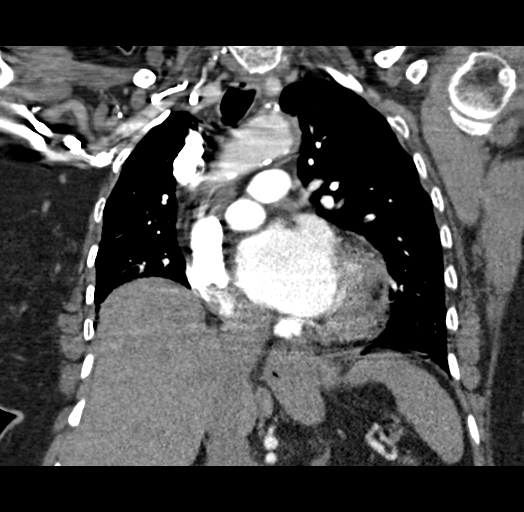
[im 93/124  soft-tissue]
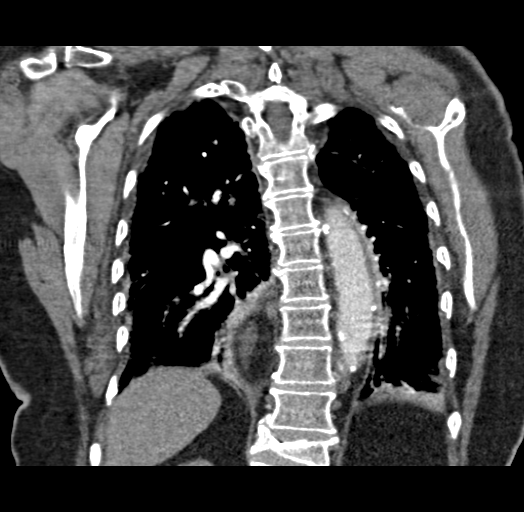

[19 of 46 positions shown; findings below may reference images not displayed]

FINDINGS: Small, elongated right upper lobe pulmonary artery filling defect.
There is a similar, small filling defect and a left lower lobe
pulmonary artery.

Moderately large hiatal hernia with an interval increase in size.
Mild bilateral dependent atelectasis. No lung nodules or enlarged
lymph nodes. Enlarged heart. Thoracic spine degenerative changes.
Progressive, marked collapse of the previously demonstrated T12
compression fracture. Unremarkable upper abdomen.

Review of the MIP images confirms the above findings.
IMPRESSION: 1. Small bilateral pulmonary emboli.
2. Mild bilateral dependent atelectasis.
3. Moderately large hiatal hernia.
4. Approximately 80% compression of the previously demonstrated T12
vertebral body fracture.
Critical Value/emergent results were called by telephone at the time
of interpretation on 08/31/2014 at [DATE] to Liu, the patient's
nurse,who verbally acknowledged these results. She stated that she
will inform Dr. Cheko.
